# Patient Record
Sex: Male | Born: 1955 | Race: Black or African American | Hispanic: No | Marital: Married | Smoking: Never smoker
Health system: Southern US, Community
[De-identification: ages and names within clinical notes are randomized; demographics above are authoritative.]

## PROBLEM LIST (undated history)

## (undated) DIAGNOSIS — E785 Hyperlipidemia, unspecified: Secondary | ICD-10-CM

## (undated) HISTORY — PX: HERNIA REPAIR: SHX51

---

## 2002-03-21 ENCOUNTER — Ambulatory Visit (HOSPITAL_COMMUNITY): Admission: RE | Admit: 2002-03-21 | Discharge: 2002-03-21 | Payer: Self-pay | Admitting: Internal Medicine

## 2004-07-20 ENCOUNTER — Ambulatory Visit (HOSPITAL_COMMUNITY): Admission: RE | Admit: 2004-07-20 | Discharge: 2004-07-20 | Payer: Self-pay | Admitting: Family Medicine

## 2005-01-12 ENCOUNTER — Ambulatory Visit: Payer: Self-pay | Admitting: Internal Medicine

## 2007-01-15 ENCOUNTER — Emergency Department (HOSPITAL_COMMUNITY): Admission: EM | Admit: 2007-01-15 | Discharge: 2007-01-15 | Payer: Self-pay | Admitting: Emergency Medicine

## 2010-01-26 ENCOUNTER — Emergency Department (HOSPITAL_COMMUNITY): Admission: EM | Admit: 2010-01-26 | Discharge: 2010-01-26 | Payer: Self-pay | Admitting: Emergency Medicine

## 2010-08-04 LAB — BASIC METABOLIC PANEL
BUN: 11 mg/dL (ref 6–23)
CO2: 30 mEq/L (ref 19–32)
Chloride: 103 mEq/L (ref 96–112)
Glucose, Bld: 89 mg/dL (ref 70–99)
Potassium: 4 mEq/L (ref 3.5–5.1)

## 2010-08-04 LAB — CBC
MCV: 85 fL (ref 78.0–100.0)
Platelets: 210 10*3/uL (ref 150–400)
RBC: 4.91 MIL/uL (ref 4.22–5.81)
RDW: 13.6 % (ref 11.5–15.5)

## 2010-08-04 LAB — POCT CARDIAC MARKERS
CKMB, poc: <1 ng/mL — ABNORMAL LOW (ref 1.0–8.0)
Troponin i, poc: <0.05 ng/mL (ref 0.00–0.09)

## 2010-08-04 LAB — DIFFERENTIAL
Basophils Absolute: 0 10*3/uL (ref 0.0–0.1)
Basophils Relative: 1 % (ref 0–1)
Eosinophils Absolute: 0.1 10*3/uL (ref 0.0–0.7)
Eosinophils Relative: 2 % (ref 0–5)
Lymphs Abs: 1.7 10*3/uL (ref 0.7–4.0)
Neutrophils Relative %: 58 % (ref 43–77)

## 2011-03-03 LAB — URINALYSIS, ROUTINE W REFLEX MICROSCOPIC
Bilirubin Urine: NEGATIVE
Ketones, ur: NEGATIVE
Nitrite: NEGATIVE
Protein, ur: NEGATIVE
pH: 6

## 2017-06-14 ENCOUNTER — Ambulatory Visit: Payer: Self-pay | Admitting: General Surgery

## 2019-07-31 ENCOUNTER — Ambulatory Visit: Payer: Self-pay | Attending: Internal Medicine

## 2019-07-31 DIAGNOSIS — Z23 Encounter for immunization: Secondary | ICD-10-CM

## 2019-07-31 NOTE — Progress Notes (Signed)
   Covid-19 Vaccination Clinic  Name:  Andres Perry    MRN: [id number] DOB: 22-Nov-1955  07/31/2019  [name] was observed post Covid-19 immunization for 15 minutes without incident. He was provided with Vaccine Information Sheet and instruction to access the V-Safe system.   [name] was instructed to call 911 with any severe reactions post vaccine: Marland Kitchen Difficulty breathing  . Swelling of face and throat  . A fast heartbeat  . A bad rash all over body  . Dizziness and weakness   Immunizations Administered    Name Date Dose VIS Date Route   Moderna COVID-19 Vaccine 07/31/2019  9:01 AM 0.5 mL 04/22/2019 Intramuscular   Manufacturer: Moderna   Lot: [id number]   NDC: 74451-460-47

## 2019-09-03 ENCOUNTER — Ambulatory Visit: Payer: Self-pay | Attending: Internal Medicine

## 2019-09-03 DIAGNOSIS — Z23 Encounter for immunization: Secondary | ICD-10-CM

## 2019-09-03 NOTE — Progress Notes (Signed)
   Covid-19 Vaccination Clinic  Name:  CHARVIS LIGHTNER    MRN: [id number] DOB: Sep 11, 1955  09/03/2019  Mr. Pizzi was observed post Covid-19 immunization for 15 minutes without incident. He was provided with Vaccine Information Sheet and instruction to access the V-Safe system.   Mr. Biel was instructed to call 911 with any severe reactions post vaccine: Marland Kitchen Difficulty breathing  . Swelling of face and throat  . A fast heartbeat  . A bad rash all over body  . Dizziness and weakness   Immunizations Administered    Name Date Dose VIS Date Route   Moderna COVID-19 Vaccine 09/03/2019  8:36 AM 0.5 mL 04/22/2019 Intramuscular   Manufacturer: Moderna   Lot: [id number]   NDC: 25894-834-75

## 2020-03-03 ENCOUNTER — Other Ambulatory Visit: Payer: Self-pay

## 2020-03-03 ENCOUNTER — Other Ambulatory Visit (HOSPITAL_COMMUNITY): Payer: Self-pay | Admitting: Family Medicine

## 2020-03-03 ENCOUNTER — Ambulatory Visit (HOSPITAL_COMMUNITY)
Admission: RE | Admit: 2020-03-03 | Discharge: 2020-03-03 | Disposition: A | Discharge status: Home / Self Care | Payer: Self-pay | Source: Ambulatory Visit | Attending: Family Medicine | Admitting: Family Medicine

## 2020-03-03 DIAGNOSIS — M25561 Pain in right knee: Secondary | ICD-10-CM | POA: Insufficient documentation

## 2020-07-08 ENCOUNTER — Encounter: Payer: Self-pay | Admitting: Orthopaedic Surgery

## 2020-07-08 ENCOUNTER — Other Ambulatory Visit: Payer: Self-pay

## 2020-07-08 ENCOUNTER — Ambulatory Visit (INDEPENDENT_AMBULATORY_CARE_PROVIDER_SITE_OTHER): Payer: BC Managed Care – PPO | Admitting: Orthopaedic Surgery

## 2020-07-08 DIAGNOSIS — M1711 Unilateral primary osteoarthritis, right knee: Secondary | ICD-10-CM | POA: Insufficient documentation

## 2020-07-08 MED ORDER — METHYLPREDNISOLONE ACETATE 40 MG/ML IJ SUSP
40.0000 mg | INTRAMUSCULAR | Status: AC | PRN
  Administered 2020-07-08: 40 mg via INTRA_ARTICULAR

## 2020-07-08 MED ORDER — BUPIVACAINE HCL 0.25 % IJ SOLN
4.0000 mL | INTRAMUSCULAR | Status: AC | PRN
  Administered 2020-07-08: 4 mL via INTRA_ARTICULAR

## 2020-07-08 MED ORDER — LIDOCAINE HCL 1 % IJ SOLN
0.5000 mL | INTRAMUSCULAR | Status: AC | PRN
  Administered 2020-07-08: .5 mL

## 2020-07-08 NOTE — Progress Notes (Signed)
Office Visit Note   Patient: Andres Perry           Date of Birth: April 01, 1956           MRN: [id number] Visit Date: 07/08/2020              Requested by: No referring provider defined for this encounter. PCP: No primary care provider on file.   Assessment & Plan: Visit Diagnoses:  1. Unilateral primary osteoarthritis, right knee     Plan: Right knee injection performed which she tolerated well.  Recheck 5 weeks.  Follow-Up Instructions: No follow-ups on file.   Orders:  No orders of the defined types were placed in this encounter.  No orders of the defined types were placed in this encounter.     Procedures: Large Joint Inj: R knee on 07/08/2020 3:48 PM Indications: pain and joint swelling Details: 22 G 1.5 in needle, anterolateral approach  Arthrogram: No  Medications: 40 mg methylPREDNISolone acetate 40 MG/ML; 0.5 mL lidocaine 1 %; 4 mL bupivacaine 0.25 % Outcome: tolerated well, no immediate complications Procedure, treatment alternatives, risks and benefits explained, specific risks discussed. Consent was given by the patient. Immediately prior to procedure a time out was called to verify the correct patient, procedure, equipment, support staff and site/side marked as required. Patient was prepped and draped in the usual sterile fashion.       Clinical Data: No additional findings.   Subjective: Chief Complaint  Patient presents with  . Right Knee - Pain    HPI 65 year old male with right knee pain x5 months.  He has no known injury.  He states he has had swelling pain with activities such as turning.  Is not actually locked but he said he has had times when he has great difficulty walking and it is very stiff when he first gets up.  He uses meloxicam without relief.  He has had times when it swells when he is on it more during the day and the ibuprofen has brought it down some.  He works part-time as a Naval architect.  He has problems getting up and down in  the truck.  Previous x-rays October 2021 demonstrated some small marginal osteophytes without significant joint space narrowing.  No history of gout.  Patient works for [employer].  Review of Systems all other systems noncontributory to HPI.   Objective: Vital Signs: Ht 5\' 7"  (1.702 m)   Wt 170 lb (77.1 kg)   BMI 26.63 kg/m   Physical Exam Constitutional:      Appearance: He is well-developed and well-nourished.  HENT:     Head: Normocephalic and atraumatic.  Eyes:     Extraocular Movements: EOM normal.     Pupils: Pupils are equal, round, and reactive to light.  Neck:     Thyroid: No thyromegaly.     Trachea: No tracheal deviation.  Cardiovascular:     Rate and Rhythm: Normal rate.  Pulmonary:     Effort: Pulmonary effort is normal.     Breath sounds: No wheezing.  Abdominal:     General: Bowel sounds are normal.     Palpations: Abdomen is soft.  Skin:    General: Skin is warm and dry.     Capillary Refill: Capillary refill takes less than 2 seconds.  Neurological:     Mental Status: He is alert and oriented to person, place, and time.  Psychiatric:        Mood and Affect: Mood  and affect normal.        Behavior: Behavior normal.        Thought Content: Thought content normal.        Judgment: Judgment normal.     Ortho Exam patient has crepitus with knee range of motion collateral ligaments are stable he does reach full extension.  Flexion to 120 degrees.  Negative hip range of motion no popliteal compression no palpable Baker's cyst.  Distal pulses are 2+.  Specialty Comments:  No specialty comments available.  Imaging: No results found.   PMFS History: Patient Active Problem List   Diagnosis Date Noted  . Unilateral primary osteoarthritis, right knee 07/08/2020   No past medical history on file.  No family history on file.   Social History   Occupational History  . Not on file  Tobacco Use  . Smoking status: Never Smoker  . Smokeless tobacco:  Never Used  Substance and Sexual Activity  . Alcohol use: Not Currently  . Drug use: Not on file  . Sexual activity: Not on file

## 2020-08-12 ENCOUNTER — Ambulatory Visit (INDEPENDENT_AMBULATORY_CARE_PROVIDER_SITE_OTHER): Payer: BC Managed Care – PPO | Admitting: Orthopaedic Surgery

## 2020-08-12 ENCOUNTER — Other Ambulatory Visit: Payer: Self-pay

## 2020-08-12 DIAGNOSIS — M1711 Unilateral primary osteoarthritis, right knee: Secondary | ICD-10-CM

## 2020-08-12 NOTE — Progress Notes (Signed)
Office Visit Note   Patient: Andres Perry           Date of Birth: [birth year]           MRN: [id number] Visit Date: 08/12/2020              Requested by: No referring provider defined for this encounter. PCP: Patient, No Pcp Per   Assessment & Plan: Visit Diagnoses:  1. Unilateral primary osteoarthritis, right knee     Plan: Patient has primarily medial arthritis medial joint line narrowing with intermittent joint effusion.  We will recheck him in 61months.  If he is having persistent problems will consider MRI imaging studies.  He likely has some degenerative changes in the medial compartment with a degenerative medial meniscal tear midportion extending to the posterior horn.  Follow-Up Instructions: Return in about 2 months (around 10/12/2020).   Orders:  No orders of the defined types were placed in this encounter.  No orders of the defined types were placed in this encounter.     Procedures: No procedures performed   Clinical Data: No additional findings.   Subjective: Chief Complaint  Patient presents with  . Right Knee - Follow-up    HPI 65 year old male returns for follow-up right knee injection 07/08/2020.  He states he continues to have some catching and popping.  It sometimes wakes him up he has problems with stiffness.  Some days he has minimal problems with his knee.  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  He has to roll some doors up crank up to release the trailer etc.  He states the knee injection may be gave him 20% relief but not 50%.  He has more problems climbing up and down on the truck when he works on the door.  Review of Systems All other systems noncontributory to HPI.  Objective: Vital Signs: There were no vitals taken for this visit.  Physical Exam Constitutional:      Appearance: He is well-developed.  HENT:     Head: Normocephalic and atraumatic.  Eyes:     Pupils: Pupils are equal, round, and reactive to light.   Neck:     Thyroid: No thyromegaly.     Trachea: No tracheal deviation.  Cardiovascular:     Rate and Rhythm: Normal rate.  Pulmonary:     Effort: Pulmonary effort is normal.     Breath sounds: No wheezing.  Abdominal:     General: Bowel sounds are normal.     Palpations: Abdomen is soft.  Skin:    General: Skin is warm and dry.     Capillary Refill: Capillary refill takes less than 2 seconds.  Neurological:     Mental Status: He is alert and oriented to person, place, and time.  Psychiatric:        Behavior: Behavior normal.        Thought Content: Thought content normal.        Judgment: Judgment normal.     Ortho Exam patient ambulates with a right knee limp that gets better after multiple steps.  Collateral ligaments are stable more tenderness along the medial joint line directly the region of the medial collateral ligament.  No palpable Baker's cyst no pain with hyperextension ACL PCL exam is normal hip range of motion is normal pulses 2+.  Anterior tib EHL is intact.  Specialty Comments:  No specialty comments available.  Imaging: No results found.   PMFS History: Patient Active Problem List  Diagnosis Date Noted  . Unilateral primary osteoarthritis, right knee 07/08/2020   No past medical history on file.  No family history on file.   Social History   Occupational History  . Not on file  Tobacco Use  . Smoking status: Never Smoker  . Smokeless tobacco: Never Used  Substance and Sexual Activity  . Alcohol use: Not Currently  . Drug use: Not on file  . Sexual activity: Not on file

## 2020-10-07 ENCOUNTER — Ambulatory Visit: Payer: BC Managed Care – PPO | Admitting: Orthopaedic Surgery

## 2022-01-31 ENCOUNTER — Ambulatory Visit (INDEPENDENT_AMBULATORY_CARE_PROVIDER_SITE_OTHER): Payer: BC Managed Care – PPO

## 2022-01-31 ENCOUNTER — Ambulatory Visit
Admission: EM | Admit: 2022-01-31 | Discharge: 2022-01-31 | Disposition: A | Discharge status: Home / Self Care | Payer: BC Managed Care – PPO | Attending: Nurse Practitioner | Admitting: Nurse Practitioner

## 2022-01-31 ENCOUNTER — Encounter: Payer: Self-pay | Admitting: Emergency Medicine

## 2022-01-31 DIAGNOSIS — R140 Abdominal distension (gaseous): Secondary | ICD-10-CM | POA: Diagnosis not present

## 2022-01-31 DIAGNOSIS — N3000 Acute cystitis without hematuria: Secondary | ICD-10-CM | POA: Diagnosis present

## 2022-01-31 LAB — POCT URINALYSIS DIP (MANUAL ENTRY)
Bilirubin, UA: NEGATIVE
Glucose, UA: NEGATIVE mg/dL
Nitrite, UA: POSITIVE — AB
Protein Ur, POC: NEGATIVE mg/dL
Spec Grav, UA: 1.01 (ref 1.010–1.025)
Urobilinogen, UA: 0.2 E.U./dL
pH, UA: 5.5 (ref 5.0–8.0)

## 2022-01-31 MED ORDER — POLYETHYLENE GLYCOL 3350 17 GM/SCOOP PO POWD: 1.0000 | Freq: Once | ORAL | 0 refills | Status: DC

## 2022-01-31 MED ORDER — TAMSULOSIN HCL 0.4 MG PO CAPS: 0.4000 mg | ORAL_CAPSULE | Freq: Every day | ORAL | 0 refills | Status: DC

## 2022-01-31 NOTE — Discharge Instructions (Addendum)
-  Urinalysis is positive for urinary tract infection.  Urine culture is pending to ensure you are being treated with the appropriate antibiotic.  Continue the antibiotic previously prescribed.  The medication needs to be changed, you will be contacted. -Your x-ray does not show any signs of a bowel obstruction. -Take medications as prescribed. -Increase fluids. -Ibuprofen or Tylenol for pain, fever, or general discomfort. -Develop a toileting schedule that will allow you to toilet at least every 2 hours. -Avoid caffeine to include tea, soda, and coffee. -Follow-up in the emergency department if you develop fever, chills, worsening abdominal pain, worsening bloating, continued difficulty urinating, or other concerns.

## 2022-01-31 NOTE — ED Triage Notes (Signed)
Stomach pain over the weekend.  States he could not urinate.  States he can urinate a little every 5 minutes.  Was given SMZ/TMP by "doctor on demand"  and was diagnosed with a UTI yesterday.  States his stomach is now swelling.

## 2022-01-31 NOTE — ED Provider Notes (Signed)
[facility]    CSN: [id number] Arrival date & time: 01/31/22  1730      History   Chief Complaint No chief complaint on file.   HPI Andres Perry is a 65 y.o. male.   The history is provided by the patient.   Patient presents for a 2-day history of urinary urgency, decreased urine stream, hesitancy, and suprapubic pressure.  Patient states he has not been able to go with the regular urine stream over the past 2 days.  States he can urinate a little every 5 minutes.  Patient completed a visit with "Dr. Glenna Fellows" 1 day ago and was diagnosed with a urinary tract infection.  Patient was prescribed Bactrim for his symptoms.  Patient states that he has taken 2 doses, but has not noticed any improvement.  Patient also complains of constipation, stating his last bowel movement was approximately 2 days ago.  Patient denies fever, chills, chest pain, shortness of breath, difficulty breathing, nausea, vomiting, or diarrhea.  He states that he has been having lower abdominal pain and has been taking ibuprofen for his symptoms.  He also reports a decreased appetite.  Patient denies any previous history of prostate disease, although he states he does not have a primary care physician.  He is scheduled to establish care with a PCP on 9/19.    History reviewed. No pertinent past medical history.  Patient Active Problem List   Diagnosis Date Noted   Unilateral primary osteoarthritis, right knee 07/08/2020    History reviewed. No pertinent surgical history.     Home Medications    Prior to Admission medications   Medication Sig Start Date End Date Taking? Authorizing Provider  tamsulosin (FLOMAX) 0.4 MG CAPS capsule Take 1 capsule (0.4 mg total) by mouth daily after supper. 01/31/22  Yes Janifer Gieselman-Warren, Sadie Haber, NP  escitalopram (LEXAPRO) 10 MG tablet Take 10 mg by mouth daily. 03/23/20   [provider]  meloxicam (MOBIC) 15 MG tablet Take 15 mg by mouth daily. 06/21/20    [provider]  simvastatin (ZOCOR) 40 MG tablet Take 40 mg by mouth at bedtime. 05/25/20   [provider]  tadalafil (CIALIS) 5 MG tablet Take 5 mg by mouth daily. 06/21/20   [provider]  zolpidem (AMBIEN) 5 MG tablet Take 5 mg by mouth at bedtime as needed. 05/25/20   [provider]    Family History History reviewed. No pertinent family history.  Social History Social History   Tobacco Use   Smoking status: Never   Smokeless tobacco: Never  Substance Use Topics   Alcohol use: Not Currently     Allergies   Patient has no known allergies.   Review of Systems Review of Systems Per HPI  Physical Exam Triage Vital Signs ED Triage Vitals  Enc Vitals Group     BP 01/31/22 1737 130/65     Pulse Rate 01/31/22 1737 98     Resp 01/31/22 1737 16     Temp 01/31/22 1737 98.6 F (37 C)     Temp Source 01/31/22 1737 Oral     SpO2 01/31/22 1737 96 %     Weight --      Height --      Head Circumference --      Peak Flow --      Pain Score 01/31/22 1739 9     Pain Loc --      Pain Edu? --      Excl.  in GC? --    No data found.  Updated Vital Signs BP 130/65 (BP Location: Right Arm)   Pulse 98   Temp 98.6 F (37 C) (Oral)   Resp 16   SpO2 96%   Visual Acuity Right Eye Distance:   Left Eye Distance:   Bilateral Distance:    Right Eye Near:   Left Eye Near:    Bilateral Near:     Physical Exam Vitals and nursing note reviewed.  Constitutional:      General: He is not in acute distress.    Appearance: Normal appearance.  HENT:     Head: Normocephalic.  Eyes:     Extraocular Movements: Extraocular movements intact.     Conjunctiva/sclera: Conjunctivae normal.     Pupils: Pupils are equal, round, and reactive to light.  Cardiovascular:     Rate and Rhythm: Normal rate and regular rhythm.     Pulses: Normal pulses.     Heart sounds: Normal heart sounds.  Pulmonary:     Effort: Pulmonary effort is normal.     Breath  sounds: Normal breath sounds.  Abdominal:     General: Bowel sounds are normal. There is distension.     Tenderness: There is abdominal tenderness in the suprapubic area. There is no right CVA tenderness or left CVA tenderness.  Musculoskeletal:     Cervical back: Normal range of motion.  Lymphadenopathy:     Cervical: No cervical adenopathy.  Skin:    General: Skin is warm and dry.  Neurological:     General: No focal deficit present.     Mental Status: He is alert and oriented to person, place, and time.  Psychiatric:        Mood and Affect: Mood normal.        Behavior: Behavior normal.      UC Treatments / Results  Labs (all labs ordered are listed, but only abnormal results are displayed) Labs Reviewed  POCT URINALYSIS DIP (MANUAL ENTRY) - Abnormal; Notable for the following components:      Result Value   Clarity, UA hazy (*)    Ketones, POC UA trace (5) (*)    Blood, UA large (*)    Nitrite, UA Positive (*)    Leukocytes, UA Moderate (2+) (*)    All other components within normal limits  URINE CULTURE    EKG   Radiology DG Abd 1 View  Result Date: 01/31/2022 CLINICAL DATA:  Abdominal distension. EXAM: ABDOMEN - 1 VIEW COMPARISON:  Abdominal CT 07/20/2004. FINDINGS: The bowel gas pattern is normal. No supine evidence of bowel wall thickening or free intraperitoneal air. There are mild degenerative changes in the spine associated with a mild convex right lumbar scoliosis. No suspicious abdominal calcifications are identified. Metallic button and zipper overly the lower pelvis. IMPRESSION: No radiographic evidence of active abdominal process. Electronically Signed   By: Carey Bullocks M.D.   On: 01/31/2022 18:15    Procedures Procedures (including critical care time)  Medications Ordered in UC Medications - No data to display  Initial Impression / Assessment and Plan / UC Course  I have reviewed the triage vital signs and the nursing notes.  Pertinent labs &  imaging results that were available during my care of the patient were reviewed by me and considered in my medical decision making (see chart for details).  The patient presents with a 2-day history of urinary urgency, hesitancy, and decreased urine flow.  On exam, patient has  abdominal distention, but he does not have any CVA tenderness.  X-ray was normal, without symptoms of constipation or obvious obstruction.  Urinalysis is positive for nitrates and leukocytes, consistent with a urinary tract infection, urine culture is pending to ensure patient is being treated with the appropriate antibiotic.Marland Kitchen  Discussed with patient that most likely this may be causing his urinary symptoms and retention at this time.  Patient was prescribed Flomax to help with his urine stream.  Patient was advised that if his symptoms of urinary retention and abdominal distention continue, recommend that he go to the emergency department.  Patient would like to see if the antibiotics are working within the next 12 to 24 hours.  I am in agreement with this plan of care.  Patient was advised to go sooner for any worsening symptoms.  Patient was also advised that if symptoms do not improve within that timeframe, that he go to the emergency department for further evaluation.  Supportive care recommendations were provided to the patient.  Patient verbalizes understanding.  All questions were answered. Final Clinical Impressions(s) / UC Diagnoses   Final diagnoses:  Acute cystitis without hematuria     Discharge Instructions      -Urinalysis is positive for urinary tract infection.  Urine culture is pending to ensure you are being treated with the appropriate antibiotic.  Continue the antibiotic previously prescribed.  The medication needs to be changed, you will be contacted. -Your x-ray does not show any signs of a bowel obstruction. -Take medications as prescribed. -Increase fluids. -Ibuprofen or Tylenol for pain, fever, or  general discomfort. -Develop a toileting schedule that will allow you to toilet at least every 2 hours. -Avoid caffeine to include tea, soda, and coffee. -Follow-up in the emergency department if you develop fever, chills, worsening abdominal pain, worsening bloating, continued difficulty urinating, or other concerns.     ED Prescriptions     Medication Sig Dispense Auth. Provider   tamsulosin (FLOMAX) 0.4 MG CAPS capsule Take 1 capsule (0.4 mg total) by mouth daily after supper. 30 capsule Xara Paulding-Warren, Sadie Haber, NP   polyethylene glycol powder (GLYCOLAX/MIRALAX) 17 GM/SCOOP powder  (Status: Discontinued) Take 255 g by mouth once for 1 dose. 255 g Aliyah Abeyta-Warren, Sadie Haber, NP      PDMP not reviewed this encounter.   Abran Cantor, NP 01/31/22 [id number]

## 2022-02-01 ENCOUNTER — Emergency Department (HOSPITAL_COMMUNITY)
Admission: EM | Admit: 2022-02-01 | Discharge: 2022-02-01 | Disposition: A | Discharge status: Home / Self Care | Payer: BC Managed Care – PPO | Attending: Emergency Medicine | Admitting: Emergency Medicine

## 2022-02-01 ENCOUNTER — Other Ambulatory Visit: Payer: Self-pay

## 2022-02-01 ENCOUNTER — Emergency Department (HOSPITAL_COMMUNITY): Payer: BC Managed Care – PPO

## 2022-02-01 ENCOUNTER — Encounter (HOSPITAL_COMMUNITY): Payer: Self-pay | Admitting: Emergency Medicine

## 2022-02-01 DIAGNOSIS — R339 Retention of urine, unspecified: Secondary | ICD-10-CM

## 2022-02-01 DIAGNOSIS — K5900 Constipation, unspecified: Secondary | ICD-10-CM | POA: Diagnosis not present

## 2022-02-01 DIAGNOSIS — D72829 Elevated white blood cell count, unspecified: Secondary | ICD-10-CM | POA: Diagnosis not present

## 2022-02-01 DIAGNOSIS — R7989 Other specified abnormal findings of blood chemistry: Secondary | ICD-10-CM | POA: Diagnosis not present

## 2022-02-01 DIAGNOSIS — N410 Acute prostatitis: Secondary | ICD-10-CM | POA: Insufficient documentation

## 2022-02-01 DIAGNOSIS — R1030 Lower abdominal pain, unspecified: Secondary | ICD-10-CM | POA: Diagnosis present

## 2022-02-01 DIAGNOSIS — R748 Abnormal levels of other serum enzymes: Secondary | ICD-10-CM | POA: Insufficient documentation

## 2022-02-01 HISTORY — DX: Hyperlipidemia, unspecified: E78.5

## 2022-02-01 LAB — CBC WITH DIFFERENTIAL/PLATELET
Abs Immature Granulocytes: 0.05 10*3/uL (ref 0.00–0.07)
Basophils Absolute: 0 10*3/uL (ref 0.0–0.1)
Basophils Relative: 0 %
Eosinophils Absolute: 0 10*3/uL (ref 0.0–0.5)
Eosinophils Relative: 0 %
HCT: 38.8 % — ABNORMAL LOW (ref 39.0–52.0)
Hemoglobin: 13 g/dL (ref 13.0–17.0)
Immature Granulocytes: 0 %
Lymphocytes Relative: 6 %
Lymphs Abs: 0.7 10*3/uL (ref 0.7–4.0)
MCH: 29 pg (ref 26.0–34.0)
MCHC: 33.5 g/dL (ref 30.0–36.0)
MCV: 86.6 fL (ref 80.0–100.0)
Monocytes Absolute: 1.1 10*3/uL — ABNORMAL HIGH (ref 0.1–1.0)
Monocytes Relative: 8 %
Neutro Abs: 11.4 10*3/uL — ABNORMAL HIGH (ref 1.7–7.7)
Neutrophils Relative %: 86 %
Platelets: 193 10*3/uL (ref 150–400)
RBC: 4.48 MIL/uL (ref 4.22–5.81)
RDW: 13.5 % (ref 11.5–15.5)
WBC: 13.3 10*3/uL — ABNORMAL HIGH (ref 4.0–10.5)
nRBC: 0 % (ref 0.0–0.2)

## 2022-02-01 LAB — URINALYSIS, ROUTINE W REFLEX MICROSCOPIC
Bilirubin Urine: NEGATIVE
Glucose, UA: NEGATIVE mg/dL
Ketones, ur: 5 mg/dL — AB
Nitrite: NEGATIVE
Protein, ur: NEGATIVE mg/dL
Specific Gravity, Urine: 1.006 (ref 1.005–1.030)
pH: 5 (ref 5.0–8.0)

## 2022-02-01 LAB — COMPREHENSIVE METABOLIC PANEL
ALT: 20 U/L (ref 0–44)
AST: 22 U/L (ref 15–41)
Albumin: 3.5 g/dL (ref 3.5–5.0)
Alkaline Phosphatase: 61 U/L (ref 38–126)
Anion gap: 9 (ref 5–15)
BUN: 18 mg/dL (ref 8–23)
CO2: 26 mmol/L (ref 22–32)
Calcium: 8.7 mg/dL — ABNORMAL LOW (ref 8.9–10.3)
Chloride: 100 mmol/L (ref 98–111)
Creatinine, Ser: 1.49 mg/dL — ABNORMAL HIGH (ref 0.61–1.24)
GFR, Estimated: 51 mL/min — ABNORMAL LOW (ref >60)
Glucose, Bld: 119 mg/dL — ABNORMAL HIGH (ref 70–99)
Potassium: 3.9 mmol/L (ref 3.5–5.1)
Sodium: 135 mmol/L (ref 135–145)
Total Bilirubin: 1 mg/dL (ref 0.3–1.2)
Total Protein: 7.2 g/dL (ref 6.5–8.1)

## 2022-02-01 LAB — LIPASE, BLOOD: Lipase: 113 U/L — ABNORMAL HIGH (ref 11–51)

## 2022-02-01 MED ORDER — IOHEXOL 300 MG/ML  SOLN
100.0000 mL | Freq: Once | INTRAMUSCULAR | Status: AC | PRN
  Administered 2022-02-01: 100 mL via INTRAVENOUS

## 2022-02-01 MED ORDER — SODIUM CHLORIDE 0.9 % IV BOLUS
500.0000 mL | Freq: Once | INTRAVENOUS | Status: AC
  Administered 2022-02-01: 500 mL via INTRAVENOUS

## 2022-02-01 MED ORDER — SULFAMETHOXAZOLE-TRIMETHOPRIM 800-160 MG PO TABS: 1.0000 | ORAL_TABLET | Freq: Two times a day (BID) | ORAL | 0 refills | Status: AC

## 2022-02-01 MED ORDER — MORPHINE SULFATE (PF) 4 MG/ML IV SOLN
4.0000 mg | Freq: Once | INTRAVENOUS | Status: AC
  Administered 2022-02-01: 4 mg via INTRAVENOUS
  Filled 2022-02-01: qty 1

## 2022-02-01 MED ORDER — ONDANSETRON HCL 4 MG/2ML IJ SOLN
4.0000 mg | Freq: Once | INTRAMUSCULAR | Status: AC
  Administered 2022-02-01: 4 mg via INTRAVENOUS
  Filled 2022-02-01: qty 2

## 2022-02-01 NOTE — ED Notes (Signed)
Bladder scanner revealed over 200 mL of urine

## 2022-02-01 NOTE — ED Triage Notes (Signed)
Pt to the ED with abdominal pain since Monday with diarrhea.  Pt states he has had problems with urinary frequency.

## 2022-02-01 NOTE — Discharge Instructions (Addendum)
You had urinary retention, please keep catheter in place and clean this daily until you follow-up with your urologist.  Call to schedule follow-up appointment with Dr. Ronne Binning within the next week.  Your CT scan showed an enlarged prostate but with some signs of inflammation and possible infection.  It please complete Bactrim that you were prescribed on Monday and you have been prescribed additional 25 days of Bactrim for extended course of antibiotics for potential prostatitis.  If you develop fevers, nausea, vomiting, worsening pain, urine is not flowing into your catheter bag or other new or concerning symptoms you should immediately return to the emergency department or call the urologist.

## 2022-02-01 NOTE — ED Notes (Signed)
Patient transported to CT 

## 2022-02-01 NOTE — ED Notes (Signed)
Over 2000 mL in urine drainage bag

## 2022-02-02 LAB — URINE CULTURE: Culture: 60000 — AB

## 2022-02-08 ENCOUNTER — Telehealth: Payer: Self-pay

## 2022-02-08 ENCOUNTER — Ambulatory Visit: Payer: BC Managed Care – PPO | Admitting: Physician Assistant

## 2022-02-08 VITALS — BP 106/73 | HR 73

## 2022-02-08 DIAGNOSIS — R339 Retention of urine, unspecified: Secondary | ICD-10-CM

## 2022-02-08 NOTE — Telephone Encounter (Signed)
Tried calling patient to follow up after voiding trial. Called patient with no answer. Left voicemail for patient to call our office back.

## 2022-02-08 NOTE — Progress Notes (Signed)
Fill and Pull Catheter Removal  Patient is present today for a catheter removal.  Patient was cleaned and prepped in a sterile fashion 300 ml of sterile water/ saline was instilled into the bladder when the patient felt the urge to urinate. 10 ml of water was then drained from the balloon.  A 16 FR foley cath was removed from the bladder no complications were noted .  Patient as then given some time to void on their own.  Patient cannot void  on their own but will return later today for follow up PVR Patient tolerated well.  Performed by: Marisue Brooklyn, CMA  Follow up/ Additional notes: Follow up this afternoon for PVR

## 2022-02-08 NOTE — Patient Instructions (Addendum)
Return this afternoon around 2 if you are unable to urinate, sooner if you develop discomfort  Continue tamsulosin (Flomax) until FU in 1 month  Continue antibiotic (Bactrim DS) for 2 days.

## 2022-02-08 NOTE — Progress Notes (Addendum)
02/08/2022 9:50 AM   Andres Perry [birth year] [id number]   Assessment:  1. Urinary retention - Bladder Voiding Trial    Plan: Voiding trial was successful in the office.  Patient is advised to continue tamsulosin daily until follow-up in 1 month for PVR and UA with DRE.  He is advised to return to the office this afternoon for PVR to ensure he does not need reinsertion of the Foley.  He will return immediately if he develops pain with inability to void.  Medical record release for PSA results from primary care visit yesterday.  Patient is aware that PSA levels will most likely be elevated secondary to recent catheterization and prostatitis.  Chief Complaint: No chief complaint on file.   Referring provider: No referring provider defined for this encounter.   History of Present Illness:  Andres Perry is a 66 y.o. year old male who is seen in consultation from ED for evaluation of urinary retention 1 week ago.  Patient was seen in the emergency department on 912 and again on 913 with complaint of urinary frequency, urgency, lower abdominal pain.  He was found to be in retention on the 13th and Foley catheter was placed.  Prior to this patient was given Bactrim DS after an online appointment with diagnosis of UTI.  Patient denies history of UTIs in the past states he has never been in retention. He has had prostatitis and feels this is similar to the sxs he had with that in the past. He established care with a local primary care provider whom he saw yesterday for chronic medical problems.  Patient reports they rechecked his renal function and drew a PSA which he has never had drawn in the past.  Today he presents for voiding trial.  He has been on tamsulosin since initial ED visit.  Currently no complaints of fever, chills, gross hematuria, lower abdominal pain.  Creatinine 1.49 last week.   IPSS = 17, Q OL = 5  ED eval on 01/31/2022 Patient presents for a 2-day history of  urinary urgency, decreased urine stream, hesitancy, and suprapubic pressure.  Patient states he has not been able to go with the regular urine stream over the past 2 days.  States he can urinate a little every 5 minutes.  Patient completed a visit with "Dr. Molly Maduro" 1 day ago and was diagnosed with a urinary tract infection.  Patient was prescribed Bactrim for his symptoms.  Patient states that he has taken 2 doses, but has not noticed any improvement.  Patient also complains of constipation, stating his last bowel movement was approximately 2 days ago.  Patient denies fever, chills, chest pain, shortness of breath, difficulty breathing, nausea, vomiting, or diarrhea.  He states that he has been having lower abdominal pain and has been taking ibuprofen for his symptoms.  He also reports a decreased appetite.  Patient denies any previous history of prostate disease, although he states he does not have a primary care physician.  He is scheduled to establish care with a PCP on 9/19.    Portions of the above documentation were copied from a prior visit for review purposes only. Medical records including notes, lab results, and imaging studies reviewed during pt OV.  Past Medical History:  Past Medical History:  Diagnosis Date   Hyperlipidemia     Past Surgical History:  Past Surgical History:  Procedure Laterality Date   HERNIA REPAIR      Allergies:  No Known Allergies  Family History:  No family history on file.  Social History:  Social History   Tobacco Use   Smoking status: Never   Smokeless tobacco: Never  Vaping Use   Vaping Use: Never used  Substance Use Topics   Alcohol use: Not Currently   Drug use: Not Currently    Review of symptoms:  Constitutional:  Negative for unexplained weight loss, night sweats, fever, chills ENT:  Negative for nose bleeds, sinus pain, painful swallowing CV:  Negative for chest pain, shortness of breath, exercise intolerance, palpitations, loss of  consciousness Resp:  Negative for cough, wheezing, shortness of breath GI:  Negative for nausea, vomiting, diarrhea, bloody stools GU:  Positives noted in HPI; otherwise negative for gross hematuria, dysuria, urinary incontinence Neuro:  Negative for seizures, poor balance, limb weakness, slurred speech Psych:  Negative for lack of energy, depression, anxiety Endocrine:  Negative for polydipsia, polyuria, symptoms of hypoglycemia (dizziness, hunger, sweating) Hematologic:  Negative for anemia, purpura, petechia, prolonged or excessive bleeding, use of anticoagulants   Physical Exam: BP 106/73   Pulse 73   Constitutional:  Alert and oriented, No acute distress.  Patient is quite thin. HEENT: NCAT, moist mucus membranes.  Trachea midline, no masses. Cardiovascular: Regular rate and rhythm without murmur, rub, or gallops No clubbing, cyanosis, or edema. Respiratory: Normal respiratory effort, clear to auscultation bilaterally GI: Abdomen is soft, nontender, nondistended, no abdominal masses BACK:  Non-tender to palpation.  No CVAT Lymph: No cervical or inguinal lymphadenopathy. Skin: No obvious rashes, warm, dry, intact Neurologic: Alert and oriented, Cranial nerves grossly intact, no focal deficits, moving all 4 extremities. Psychiatric: Appropriate. Normal mood and affect.  Laboratory Data: No results found for this or any previous visit (from the past 24 hour(s)).  Lab Results  Component Value Date   WBC 13.3 (H) 02/01/2022   HGB 13.0 02/01/2022   HCT 38.8 (L) 02/01/2022   MCV 86.6 02/01/2022   PLT 193 02/01/2022    Lab Results  Component Value Date   CREATININE 1.49 (H) 02/01/2022    Urinalysis    Component Value Date/Time   COLORURINE AMBER (A) 02/01/2022 1348   APPEARANCEUR CLEAR 02/01/2022 1348   LABSPEC 1.006 02/01/2022 1348   PHURINE 5.0 02/01/2022 1348   GLUCOSEU NEGATIVE 02/01/2022 1348   HGBUR LARGE (A) 02/01/2022 1348   BILIRUBINUR NEGATIVE 02/01/2022 1348    BILIRUBINUR negative 01/31/2022 1747   KETONESUR 5 (A) 02/01/2022 1348   PROTEINUR NEGATIVE 02/01/2022 1348   UROBILINOGEN 0.2 01/31/2022 1747   UROBILINOGEN 0.2 01/15/2007 1716   NITRITE NEGATIVE 02/01/2022 1348   LEUKOCYTESUR SMALL (A) 02/01/2022 1348    Lab Results  Component Value Date   BACTERIA RARE (A) 02/01/2022    Pertinent Imaging: Results for orders placed during the hospital encounter of 01/31/22  DG Abd 1 View  Narrative CLINICAL DATA:  Abdominal distension.  EXAM: ABDOMEN - 1 VIEW  COMPARISON:  Abdominal CT 07/20/2004.  FINDINGS: The bowel gas pattern is normal. No supine evidence of bowel wall thickening or free intraperitoneal air. There are mild degenerative changes in the spine associated with a mild convex right lumbar scoliosis. No suspicious abdominal calcifications are identified. Metallic button and zipper overly the lower pelvis.  IMPRESSION: No radiographic evidence of active abdominal process.   Electronically Signed By: Carey Bullocks M.D. On: 01/31/2022 18:15  No results found for this or any previous visit.     [place], Regan Rakers, PA-C [facility] [place]

## 2022-02-09 ENCOUNTER — Ambulatory Visit (INDEPENDENT_AMBULATORY_CARE_PROVIDER_SITE_OTHER): Payer: BC Managed Care – PPO | Admitting: Physician Assistant

## 2022-02-09 VITALS — BP 124/84 | HR 73

## 2022-02-09 DIAGNOSIS — N410 Acute prostatitis: Secondary | ICD-10-CM

## 2022-02-09 DIAGNOSIS — R972 Elevated prostate specific antigen [PSA]: Secondary | ICD-10-CM | POA: Diagnosis not present

## 2022-02-09 DIAGNOSIS — R339 Retention of urine, unspecified: Secondary | ICD-10-CM

## 2022-02-09 LAB — BLADDER SCAN AMB NON-IMAGING

## 2022-02-09 NOTE — Progress Notes (Signed)
post void residual =712

## 2022-02-09 NOTE — Patient Instructions (Signed)
Continue Bactrim DS  Continue daily tamsulosin (Flomax)

## 2022-02-09 NOTE — Progress Notes (Signed)
Assessment: 1. Acute prostatitis - Urinalysis, Routine w reflex microscopic  2. Urinary retention - Bladder Scan (Post Void Residual) in office - Urinalysis, Routine w reflex microscopic  3. Elevated PSA    Plan: Remain on Flomax and continue Bactrim for full 30 days.  Foley replaced and the patient will FU in 3 weeks for voiding trial as he will have completed full course of antibx. Pt advised to FU sooner if he develops any sxs of systemic illness or develops pain. Strict ED precautions discussed. Pt discussed with Dr. [name] who agrees with plan. Will consider imaging FU if indicated to r/o developing abscess due to patient's significant pain with reinserting Foley.  We will plan recheck of PSA 2 to 3 months following removal of Foley and clearance of UTI symptoms and prostatitis.  Chief Complaint: No chief complaint on file.   HPI: Andres Perry is a 66 y.o. male who presents for continued evaluation of acute urinary retention and prostatitis. The pt was able to void small amounts yesterday, but decreasing amounts until this morning with return of lower abdominal and suprapubic pressure. No fever, chills, dysuria, gross hematuria. Pt remains on Bactrim and Flomax as directed yesterday.  PSA level from primary care office reviewed.  It was 12.9.  Creatinine level improving from ED visit.  Urine culture from ED visit on 01/31/2022 grew 60,000 colonies of Enterobacter Andres Perry resistant to cefazolin and Macrobid  UA= 3-10 RBCs, otherwise clear PVR= 712 mL PSA from primary care visit yesterday = 12.9, no comparison results available.  Portions of the above documentation were copied from a prior visit for review purposes only.  Allergies: No Known Allergies  PMH: Past Medical History:  Diagnosis Date   Hyperlipidemia     PSH: Past Surgical History:  Procedure Laterality Date   HERNIA REPAIR      SH: Social History   Tobacco Use   Smoking status: Never    Smokeless tobacco: Never  Vaping Use   Vaping Use: Never used  Substance Use Topics   Alcohol use: Not Currently   Drug use: Not Currently    ROS: All other review of systems were reviewed and are negative except what is noted above in HPI  PE: BP 124/84   Pulse 73  GENERAL APPEARANCE:  Well appearing, well developed, well nourished, NAD HEENT:  Atraumatic, normocephalic NECK:  Supple. Trachea midline ABDOMEN:  Soft, mild suprapubic tenderness with palpable bladder, no masses EXTREMITIES:  Moves all extremities well, without clubbing, cyanosis, or edema NEUROLOGIC:  Alert and oriented x 3, normal gait, CN II-XII grossly intact MENTAL STATUS:  appropriate BACK:  Non-tender to palpation, No CVAT SKIN:  Warm, dry, and intact   Results: Laboratory Data: Lab Results  Component Value Date   WBC 13.3 (H) 02/01/2022   HGB 13.0 02/01/2022   HCT 38.8 (L) 02/01/2022   MCV 86.6 02/01/2022   PLT 193 02/01/2022    Lab Results  Component Value Date   CREATININE 1.49 (H) 02/01/2022     Urinalysis    Component Value Date/Time   COLORURINE AMBER (A) 02/01/2022 1348   APPEARANCEUR CLEAR 02/01/2022 1348   LABSPEC 1.006 02/01/2022 1348   PHURINE 5.0 02/01/2022 1348   GLUCOSEU NEGATIVE 02/01/2022 1348   HGBUR LARGE (A) 02/01/2022 1348   BILIRUBINUR NEGATIVE 02/01/2022 1348   BILIRUBINUR negative 01/31/2022 1747   KETONESUR 5 (A) 02/01/2022 1348   PROTEINUR NEGATIVE 02/01/2022 1348   UROBILINOGEN 0.2 01/31/2022 1747   UROBILINOGEN  0.2 01/15/2007 1716   NITRITE NEGATIVE 02/01/2022 1348   LEUKOCYTESUR SMALL (A) 02/01/2022 1348    Lab Results  Component Value Date   BACTERIA RARE (A) 02/01/2022    Pertinent Imaging: Results for orders placed during the hospital encounter of 01/31/22  DG Abd 1 View  Narrative CLINICAL DATA:  Abdominal distension.  EXAM: ABDOMEN - 1 VIEW  COMPARISON:  Abdominal CT 07/20/2004.  FINDINGS: The bowel gas pattern is normal. No supine  evidence of bowel wall thickening or free intraperitoneal air. There are mild degenerative changes in the spine associated with a mild convex right lumbar scoliosis. No suspicious abdominal calcifications are identified. Metallic button and zipper overly the lower pelvis.  IMPRESSION: No radiographic evidence of active abdominal process.   Electronically Signed By: Richardean Sale M.D. On: 01/31/2022 18:15  No results found for this or any previous visit.  No results found for this or any previous visit.  No results found for this or any previous visit.  No results found for this or any previous visit.  No results found for this or any previous visit.  No results found for this or any previous visit.  No results found for this or any previous visit.  No results found for this or any previous visit (from the past 24 hour(s)).

## 2022-02-09 NOTE — Telephone Encounter (Signed)
Tried calling patient with no answer to follow up after voiding trail. Left voice message for patient to call our office back. Will try to call patient later today for follow up.

## 2022-02-10 LAB — URINALYSIS, ROUTINE W REFLEX MICROSCOPIC
Bilirubin, UA: NEGATIVE
Glucose, UA: NEGATIVE
Ketones, UA: NEGATIVE
Leukocytes,UA: NEGATIVE
Nitrite, UA: NEGATIVE
Protein,UA: NEGATIVE
Specific Gravity, UA: 1.015 (ref 1.005–1.030)
Urobilinogen, Ur: 0.2 mg/dL (ref 0.2–1.0)
pH, UA: 6.5 (ref 5.0–7.5)

## 2022-02-10 LAB — MICROSCOPIC EXAMINATION: Bacteria, UA: NONE SEEN

## 2022-02-10 IMAGING — DX DG KNEE 1-2V*R*
2 series · 2 of 2 positions shown · non-contrast
Comparison: None.

CLINICAL DATA: Right knee pain 1 week.  No injury

EXAM:
RIGHT KNEE - 1-2 VIEW

[knee ap]
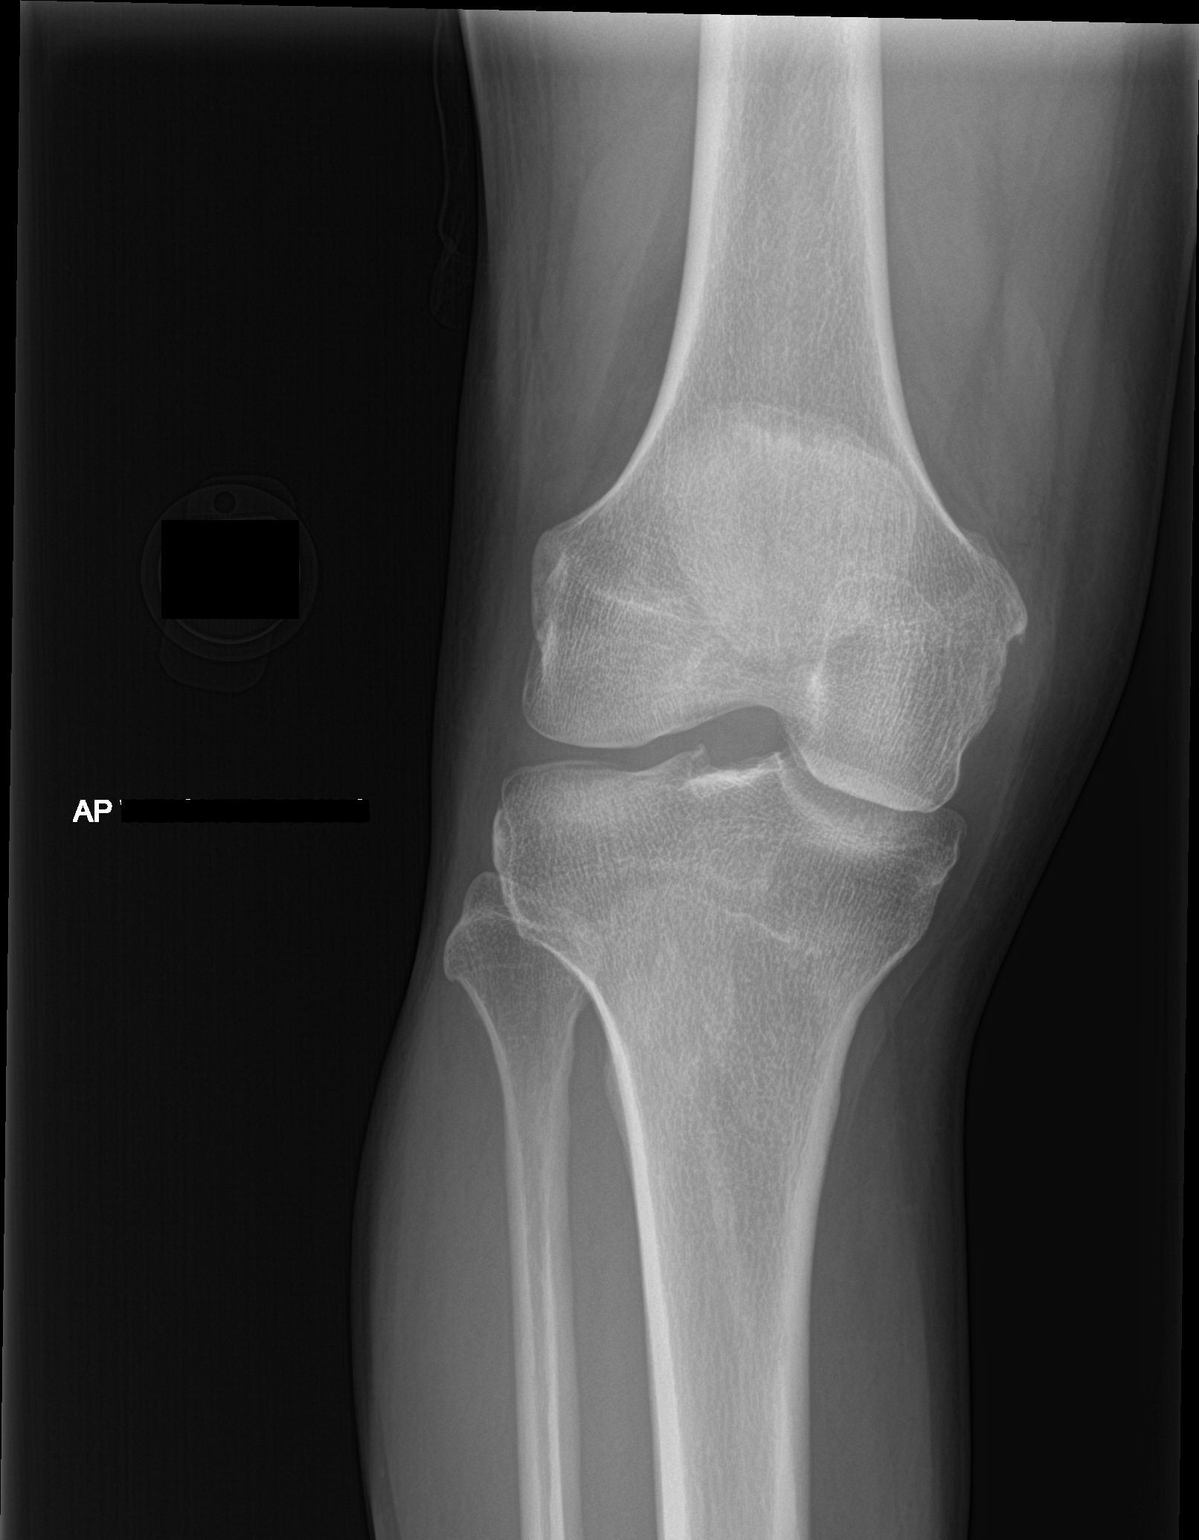

[knee lat]
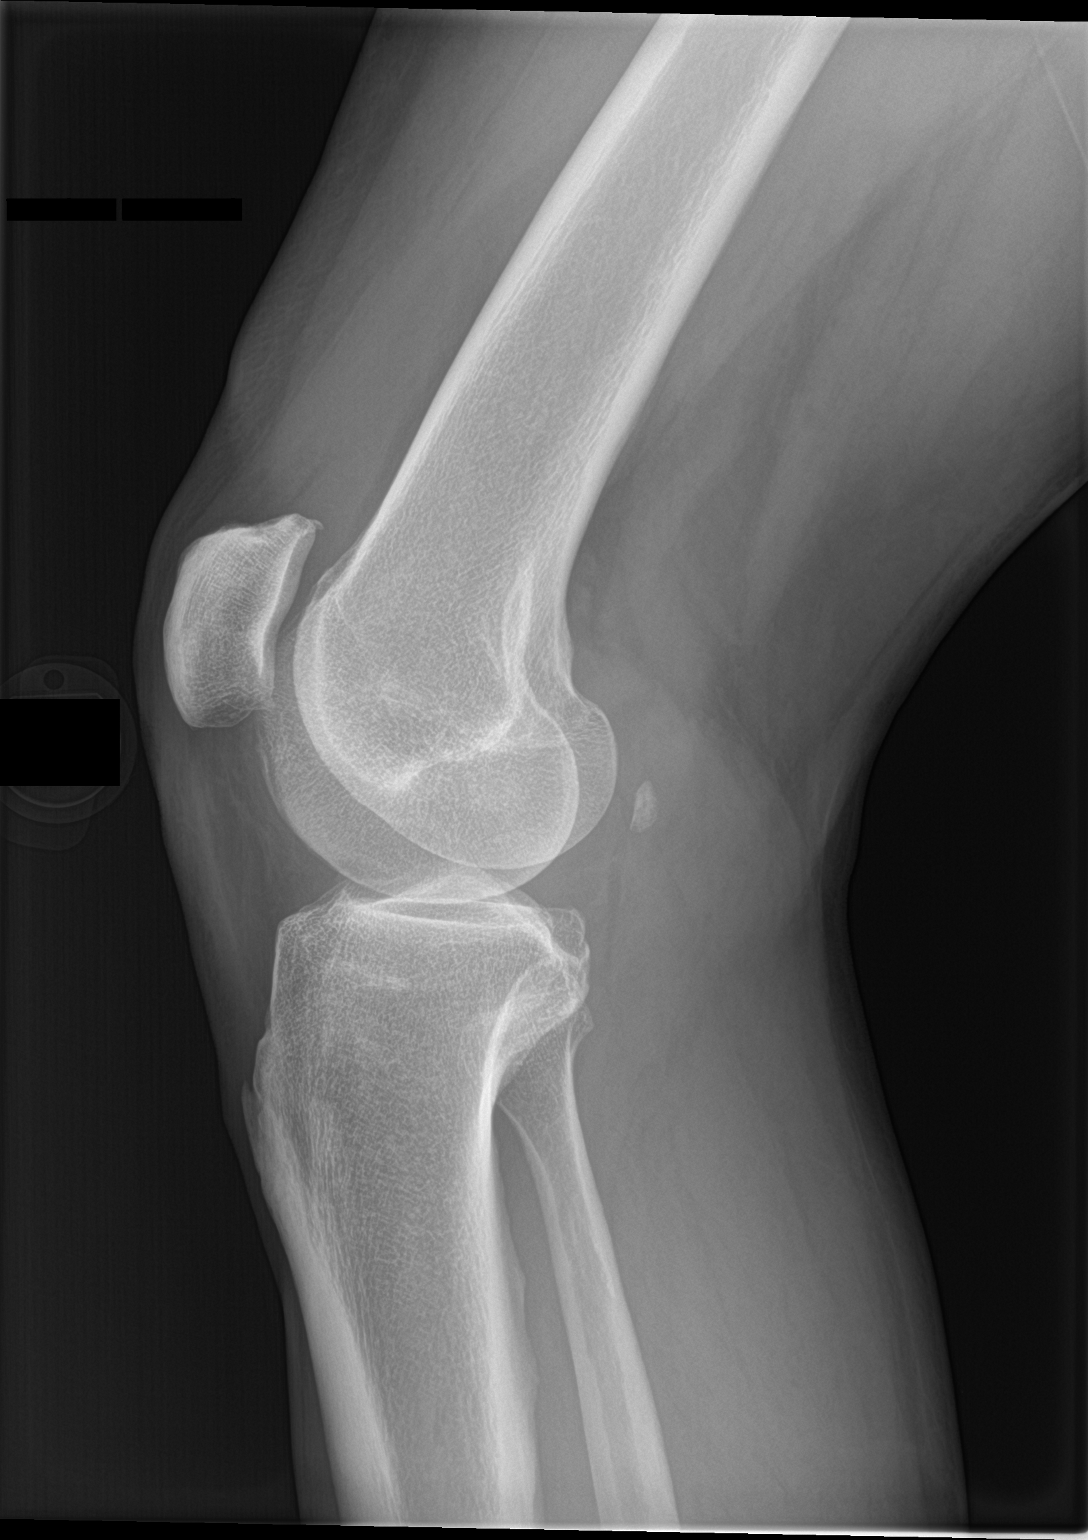

[2 of 2 positions shown; findings below may reference images not displayed]

FINDINGS: Normal alignment no fracture. Joint spaces maintained without
significant spurring. Fabella. Small joint effusion.
IMPRESSION: Small joint effusion.  No significant joint space narrowing.

## 2022-02-15 ENCOUNTER — Encounter: Payer: Self-pay | Admitting: *Deleted

## 2022-02-28 ENCOUNTER — Ambulatory Visit (INDEPENDENT_AMBULATORY_CARE_PROVIDER_SITE_OTHER): Payer: BC Managed Care – PPO | Admitting: Urology

## 2022-02-28 VITALS — BP 125/79 | HR 83

## 2022-02-28 DIAGNOSIS — N401 Enlarged prostate with lower urinary tract symptoms: Secondary | ICD-10-CM | POA: Diagnosis not present

## 2022-02-28 DIAGNOSIS — N138 Other obstructive and reflux uropathy: Secondary | ICD-10-CM | POA: Diagnosis not present

## 2022-02-28 DIAGNOSIS — R339 Retention of urine, unspecified: Secondary | ICD-10-CM

## 2022-02-28 MED ORDER — TAMSULOSIN HCL 0.4 MG PO CAPS: 0.4000 mg | ORAL_CAPSULE | Freq: Two times a day (BID) | ORAL | 11 refills | Status: DC

## 2022-02-28 NOTE — Patient Instructions (Signed)
Acute Urinary Retention, Male  Acute urinary retention is when a person cannot pee (urinate) at all, or can only pee a little. This can come on all of a sudden. If it is not treated, it can lead to kidney problems or other serious problems. What are the causes? A problem with the tube that drains the bladder (urethra). Problems with the nerves in the bladder. Tumors. Certain medicines. An infection. Having trouble pooping (constipation). What increases the risk? Older men are more at risk because their prostate gland may become larger as they age. Other conditions also can increase risk. These include: Diseases, such as multiple sclerosis. Injury to the spinal cord. Diabetes. A condition that affects the way the brain works, such as dementia. Holding back urine due to trauma or because you do not want to use the bathroom. What are the signs or symptoms? Trouble peeing. Pain in the lower belly. How is this treated? Treatment for this condition may include: Medicines. Placing a thin, germ-free tube (catheter) into the bladder to drain pee out of the body. Therapy to treat mental health conditions. Treatment for conditions that may cause this. If needed, you may be treated in the hospital for kidney problems or to manage other problems. Follow these instructions at home: Medicines Take over-the-counter and prescription medicines only as told by your doctor. Ask your doctor what medicines you should stay away from. If you were given an antibiotic medicine, take it as told by your doctor. Do not stop taking it, even if you start to feel better. General instructions Do not smoke or use any products that contain nicotine or tobacco. If you need help quitting, ask your doctor. Drink enough fluid to keep your pee pale yellow. If you were sent home with a tube that drains the bladder, take care of it as told by your doctor. Watch for changes in your symptoms. Tell your doctor about them. If  told, keep track of changes in your blood pressure at home. Tell your doctor about them. Keep all follow-up visits. Contact a doctor if: You have spasms in your bladder that you cannot stop. You leak pee when you have spasms. Get help right away if: You have chills or a fever. You have blood in your pee. You have a tube that drains pee from the bladder and these things happen: The tube stops draining pee. The tube falls out. Summary Acute urinary retention is when you cannot pee at all or you pee too little. If this condition is not treated, it can lead to kidney problems or other serious problems. If you were sent home with a tube (catheter) that drains the bladder, take care of it as told by your doctor. Watch for changes in your symptoms. Tell your doctor about them. This information is not intended to replace advice given to you by your health care provider. Make sure you discuss any questions you have with your health care provider. Document Revised: 01/28/2020 Document Reviewed: 01/28/2020 Elsevier Patient Education  2023 [employer] Inc.  

## 2022-02-28 NOTE — Progress Notes (Signed)
Fill and Pull Catheter Removal  Patient is present today for a catheter removal.  Patient was cleaned and prepped in a sterile fashion 517ml of sterile water/ saline was instilled into the bladder when the patient felt the urge to urinate. 83ml of water was then drained from the balloon.  A 16FR foley cath was removed from the bladder no complications were noted .  Patient as then given some time to void on their own.  Patient can void  269ml on their own after some time.  Patient tolerated well.  Performed by: Raynette Arras LPN  Follow up/ Additional notes: per MD note     Patient returned with PVR of 279  Patient request to go home and will return in the morning for PVR.  Patient aware if he is unable to void throughout the night that he will need to go to the ER. Patient will return in the morning for PVR

## 2022-03-01 ENCOUNTER — Telehealth: Payer: Self-pay

## 2022-03-01 ENCOUNTER — Ambulatory Visit (INDEPENDENT_AMBULATORY_CARE_PROVIDER_SITE_OTHER): Payer: BC Managed Care – PPO | Admitting: Urology

## 2022-03-01 DIAGNOSIS — R339 Retention of urine, unspecified: Secondary | ICD-10-CM

## 2022-03-01 DIAGNOSIS — R300 Dysuria: Secondary | ICD-10-CM | POA: Diagnosis not present

## 2022-03-01 MED ORDER — NITROFURANTOIN MONOHYD MACRO 100 MG PO CAPS: 100.0000 mg | ORAL_CAPSULE | Freq: Two times a day (BID) | ORAL | 0 refills | Status: DC

## 2022-03-01 NOTE — Progress Notes (Signed)
post void residual=228

## 2022-03-01 NOTE — Telephone Encounter (Signed)
Patient states pharmacy did not get his flomax rx.  Spoke to pharmacy staff and they did receive rx, patient notified.  Patient also states that he is having dysuria, he forgot to mention that whe he was in office.  Informed patient to drop off a urine today and we will call him once MD reviews.

## 2022-03-02 LAB — URINALYSIS, ROUTINE W REFLEX MICROSCOPIC
Bilirubin, UA: NEGATIVE
Glucose, UA: NEGATIVE
Nitrite, UA: NEGATIVE
Protein,UA: NEGATIVE
Specific Gravity, UA: 1.025 (ref 1.005–1.030)
Urobilinogen, Ur: 0.2 mg/dL (ref 0.2–1.0)
pH, UA: 5.5 (ref 5.0–7.5)

## 2022-03-02 LAB — MICROSCOPIC EXAMINATION: WBC, UA: >30 /hpf — AB (ref 0–5)

## 2022-03-04 LAB — URINE CULTURE

## 2022-03-07 ENCOUNTER — Encounter: Payer: Self-pay | Admitting: Urology

## 2022-03-07 NOTE — Patient Instructions (Signed)

## 2022-03-07 NOTE — Progress Notes (Signed)
03/01/2022 8:13 AM   Andres Perry [birth year] [id number]  Referring provider: Toma Deiters, MD [address] [place],  [place] [address]  Followup BPH and dysuria   HPI: Andres Perry is a 66yo here for followup for BPh with urinary retention. Voiding trial passed yesterday. He has dysuria since the foley has been removed. Urine stream strong. Intermittent straining to urinate. No other complaints today   PMH: Past Medical History:  Diagnosis Date   Hyperlipidemia     Surgical History: Past Surgical History:  Procedure Laterality Date   HERNIA REPAIR      Home Medications:  Allergies as of 03/01/2022   No Known Allergies      Medication List        Accurate as of March 01, 2022 11:59 PM. If you have any questions, ask your nurse or doctor.          escitalopram 10 MG tablet Commonly known as: LEXAPRO Take 10 mg by mouth daily.   meloxicam 15 MG tablet Commonly known as: MOBIC Take 15 mg by mouth daily.   nitrofurantoin (macrocrystal-monohydrate) 100 MG capsule Commonly known as: MACROBID Take 1 capsule (100 mg total) by mouth 2 (two) times daily. Started by: Wilkie Aye, MD   simvastatin 40 MG tablet Commonly known as: ZOCOR Take 40 mg by mouth at bedtime.   sulfamethoxazole-trimethoprim 800-160 MG tablet Commonly known as: BACTRIM DS Take 1 tablet by mouth 2 (two) times daily.   tadalafil 5 MG tablet Commonly known as: CIALIS Take 5 mg by mouth daily.   tamsulosin 0.4 MG Caps capsule Commonly known as: Flomax Take 1 capsule (0.4 mg total) by mouth 2 (two) times daily.   traZODone 50 MG tablet Commonly known as: DESYREL Take 50 mg by mouth at bedtime.   zolpidem 5 MG tablet Commonly known as: AMBIEN Take 5 mg by mouth at bedtime as needed.        Allergies: No Known Allergies  Family History: No family history on file.  Social History:  reports that he has never smoked. He has never used smokeless tobacco. He reports  that he does not currently use alcohol. He reports that he does not currently use drugs.  ROS: All other review of systems were reviewed and are negative except what is noted above in HPI  Physical Exam: There were no vitals taken for this visit.  Constitutional:  Alert and oriented, No acute distress. HEENT: [place] AT, moist mucus membranes.  Trachea midline, no masses. Cardiovascular: No clubbing, cyanosis, or edema. Respiratory: Normal respiratory effort, no increased work of breathing. GI: Abdomen is soft, nontender, nondistended, no abdominal masses GU: No CVA tenderness.  Lymph: No cervical or inguinal lymphadenopathy. Skin: No rashes, bruises or suspicious lesions. Neurologic: Grossly intact, no focal deficits, moving all 4 extremities. Psychiatric: Normal mood and affect.  Laboratory Data: Lab Results  Component Value Date   WBC 13.3 (H) 02/01/2022   HGB 13.0 02/01/2022   HCT 38.8 (L) 02/01/2022   MCV 86.6 02/01/2022   PLT 193 02/01/2022    Lab Results  Component Value Date   CREATININE 1.49 (H) 02/01/2022    No results found for: "PSA"  No results found for: "TESTOSTERONE"  No results found for: "HGBA1C"  Urinalysis    Component Value Date/Time   COLORURINE AMBER (A) 02/01/2022 1348   APPEARANCEUR Hazy (A) 03/01/2022 1448   LABSPEC 1.006 02/01/2022 1348   PHURINE 5.0 02/01/2022 1348   GLUCOSEU Negative 03/01/2022 1448  HGBUR LARGE (A) 02/01/2022 1348   BILIRUBINUR Negative 03/01/2022 1448   KETONESUR 5 (A) 02/01/2022 1348   PROTEINUR Negative 03/01/2022 1448   PROTEINUR NEGATIVE 02/01/2022 1348   UROBILINOGEN 0.2 01/31/2022 1747   UROBILINOGEN 0.2 01/15/2007 1716   NITRITE Negative 03/01/2022 1448   NITRITE NEGATIVE 02/01/2022 1348   LEUKOCYTESUR 1+ (A) 03/01/2022 1448   LEUKOCYTESUR SMALL (A) 02/01/2022 1348    Lab Results  Component Value Date   LABMICR See below: 03/01/2022   WBCUA >30 (A) 03/01/2022   LABEPIT 0-10 03/01/2022   BACTERIA Many  (A) 03/01/2022    Pertinent Imaging:  Results for orders placed during the hospital encounter of 01/31/22  DG Abd 1 View  Narrative CLINICAL DATA:  Abdominal distension.  EXAM: ABDOMEN - 1 VIEW  COMPARISON:  Abdominal CT 07/20/2004.  FINDINGS: The bowel gas pattern is normal. No supine evidence of bowel wall thickening or free intraperitoneal air. There are mild degenerative changes in the spine associated with a mild convex right lumbar scoliosis. No suspicious abdominal calcifications are identified. Metallic button and zipper overly the lower pelvis.  IMPRESSION: No radiographic evidence of active abdominal process.   Electronically Signed By: Richardean Sale M.D. On: 01/31/2022 18:15  No results found for this or any previous visit.  No results found for this or any previous visit.  No results found for this or any previous visit.  No results found for this or any previous visit.  No valid procedures specified. No results found for this or any previous visit.  No results found for this or any previous visit.   Assessment & Plan:    1. Urinary retention -Continue flomax BID - BLADDER SCAN AMB NON-IMAGING - Urinalysis, Routine w reflex microscopic - Urine Culture - Microscopic Examination  2. Dysuria  - nitrofurantoin, macrocrystal-monohydrate, (MACROBID) 100 MG capsule; Take 1 capsule (100 mg total) by mouth 2 (two) times daily.  Dispense: 14 capsule; Refill: 0   No follow-ups on file.  Nicolette Bang, MD  [facility] [place]

## 2022-03-07 NOTE — Progress Notes (Signed)
02/28/2022 8:15 AM   Andres Perry Oct 05, 1955 [id number]  Referring provider: No referring provider defined for this encounter.  Followup BPH  HPI: Mr Andres Perry is a 66yo here for followup for BPH with Urinary retention. He is currently on flomax 0.4mg  daily. He recently finished bactrim for prostatitis. He currently has an indwelling foley. No other complaints today   PMH: Past Medical History:  Diagnosis Date   Hyperlipidemia     Surgical History: Past Surgical History:  Procedure Laterality Date   HERNIA REPAIR      Home Medications:  Allergies as of 02/28/2022   No Known Allergies      Medication List        Accurate as of February 28, 2022 11:59 PM. If you have any questions, ask your nurse or doctor.          escitalopram 10 MG tablet Commonly known as: LEXAPRO Take 10 mg by mouth daily.   meloxicam 15 MG tablet Commonly known as: MOBIC Take 15 mg by mouth daily.   simvastatin 40 MG tablet Commonly known as: ZOCOR Take 40 mg by mouth at bedtime.   sulfamethoxazole-trimethoprim 800-160 MG tablet Commonly known as: BACTRIM DS Take 1 tablet by mouth 2 (two) times daily.   tadalafil 5 MG tablet Commonly known as: CIALIS Take 5 mg by mouth daily.   tamsulosin 0.4 MG Caps capsule Commonly known as: Flomax Take 1 capsule (0.4 mg total) by mouth 2 (two) times daily. What changed: when to take this   traZODone 50 MG tablet Commonly known as: DESYREL Take 50 mg by mouth at bedtime.   zolpidem 5 MG tablet Commonly known as: AMBIEN Take 5 mg by mouth at bedtime as needed.        Allergies: No Known Allergies  Family History: No family history on file.  Social History:  reports that he has never smoked. He has never used smokeless tobacco. He reports that he does not currently use alcohol. He reports that he does not currently use drugs.  ROS: All other review of systems were reviewed and are negative except what is noted above in  HPI  Physical Exam: BP 125/79   Pulse 83   Constitutional:  Alert and oriented, No acute distress. HEENT: [place] AT, moist mucus membranes.  Trachea midline, no masses. Cardiovascular: No clubbing, cyanosis, or edema. Respiratory: Normal respiratory effort, no increased work of breathing. GI: Abdomen is soft, nontender, nondistended, no abdominal masses GU: No CVA tenderness.  Lymph: No cervical or inguinal lymphadenopathy. Skin: No rashes, bruises or suspicious lesions. Neurologic: Grossly intact, no focal deficits, moving all 4 extremities. Psychiatric: Normal mood and affect.  Laboratory Data: Lab Results  Component Value Date   WBC 13.3 (H) 02/01/2022   HGB 13.0 02/01/2022   HCT 38.8 (L) 02/01/2022   MCV 86.6 02/01/2022   PLT 193 02/01/2022    Lab Results  Component Value Date   CREATININE 1.49 (H) 02/01/2022    No results found for: "PSA"  No results found for: "TESTOSTERONE"  No results found for: "HGBA1C"  Urinalysis    Component Value Date/Time   COLORURINE AMBER (A) 02/01/2022 1348   APPEARANCEUR Hazy (A) 03/01/2022 1448   LABSPEC 1.006 02/01/2022 1348   PHURINE 5.0 02/01/2022 1348   GLUCOSEU Negative 03/01/2022 1448   HGBUR LARGE (A) 02/01/2022 1348   BILIRUBINUR Negative 03/01/2022 1448   KETONESUR 5 (A) 02/01/2022 1348   PROTEINUR Negative 03/01/2022 1448   PROTEINUR NEGATIVE 02/01/2022 1348  UROBILINOGEN 0.2 01/31/2022 1747   UROBILINOGEN 0.2 01/15/2007 1716   NITRITE Negative 03/01/2022 1448   NITRITE NEGATIVE 02/01/2022 1348   LEUKOCYTESUR 1+ (A) 03/01/2022 1448   LEUKOCYTESUR SMALL (A) 02/01/2022 1348    Lab Results  Component Value Date   LABMICR See below: 03/01/2022   WBCUA >30 (A) 03/01/2022   LABEPIT 0-10 03/01/2022   BACTERIA Many (A) 03/01/2022    Pertinent Imaging:  Results for orders placed during the hospital encounter of 01/31/22  DG Abd 1 View  Narrative CLINICAL DATA:  Abdominal distension.  EXAM: ABDOMEN - 1  VIEW  COMPARISON:  Abdominal CT 07/20/2004.  FINDINGS: The bowel gas pattern is normal. No supine evidence of bowel wall thickening or free intraperitoneal air. There are mild degenerative changes in the spine associated with a mild convex right lumbar scoliosis. No suspicious abdominal calcifications are identified. Metallic button and zipper overly the lower pelvis.  IMPRESSION: No radiographic evidence of active abdominal process.   Electronically Signed By: Richardean Sale M.D. On: 01/31/2022 18:15  No results found for this or any previous visit.  No results found for this or any previous visit.  No results found for this or any previous visit.  No results found for this or any previous visit.  No valid procedures specified. No results found for this or any previous visit.  No results found for this or any previous visit.   Assessment & Plan:    1. Urinary retention -Voiding trial passed today. Followup tomorrow with a PVR - Bladder Voiding Trial  2. Benign prostatic hyperplasia with urinary obstruction -Increase flomax to 0.4mg  BID   No follow-ups on file.  Nicolette Bang, MD  [facility] [place]

## 2022-03-08 ENCOUNTER — Encounter: Payer: Self-pay | Admitting: *Deleted

## 2022-03-08 NOTE — Patient Instructions (Signed)
  Procedure: colonoscopy  Estimated body mass index is 24.28 kg/m as calculated from the following:   Height as of this encounter: 5\' 7"  (1.702 m).   Weight as of this encounter: 155 lb (70.3 kg).   Have you had a colonoscopy before?  2006, Dr. Laural Golden  Do you have family history of colon cancer  no  Do you have a family history of polyps? no  Previous colonoscopy with polyps removed? yes  Do you have a history colorectal cancer?   no  Are you diabetic?  no  Do you have a prosthetic or mechanical heart valve? no  Do you have a pacemaker/defibrillator?   no  Have you had endocarditis/atrial fibrillation?  no  Do you use supplemental oxygen/CPAP?  no  Have you had joint replacement within the last 12 months?  no  Do you tend to be constipated or have to use laxatives?  yes   Do you have history of alcohol use? If yes, how much and how often.  no  Do you have history or are you using drugs? If yes, what do are you  using?  no  Have you ever had a stroke/heart attack?  no  Have you ever had a heart or other vascular stent placed,?no  Do you take weight loss medication? no  Do you take any blood-thinning medications such as: (Plavix, aspirin, Coumadin, Aggrenox, Brilinta, Xarelto, Eliquis, Pradaxa, Savaysa or Effient) no  If yes we need the name, milligram, dosage and who is prescribing doctor:               Current Outpatient Medications  Medication Sig Dispense Refill   Multiple Vitamin (MULTIVITAMIN) tablet Take 1 tablet by mouth daily.     nitrofurantoin, macrocrystal-monohydrate, (MACROBID) 100 MG capsule Take 1 capsule (100 mg total) by mouth 2 (two) times daily. 14 capsule 0   simvastatin (ZOCOR) 40 MG tablet Take 40 mg by mouth at bedtime.     tamsulosin (FLOMAX) 0.4 MG CAPS capsule Take 1 capsule (0.4 mg total) by mouth 2 (two) times daily. 60 capsule 11   traZODone (DESYREL) 50 MG tablet Take 50 mg by mouth at bedtime.     sulfamethoxazole-trimethoprim  (BACTRIM DS) 800-160 MG tablet Take 1 tablet by mouth 2 (two) times daily.     No current facility-administered medications for this visit.    No Known Allergies

## 2022-03-14 ENCOUNTER — Encounter: Payer: Self-pay | Admitting: Urology

## 2022-03-14 ENCOUNTER — Ambulatory Visit: Payer: BC Managed Care – PPO | Admitting: Urology

## 2022-03-14 VITALS — BP 137/82 | HR 77

## 2022-03-14 DIAGNOSIS — N138 Other obstructive and reflux uropathy: Secondary | ICD-10-CM

## 2022-03-14 DIAGNOSIS — N401 Enlarged prostate with lower urinary tract symptoms: Secondary | ICD-10-CM | POA: Diagnosis not present

## 2022-03-14 DIAGNOSIS — R339 Retention of urine, unspecified: Secondary | ICD-10-CM | POA: Diagnosis not present

## 2022-03-14 LAB — URINALYSIS, ROUTINE W REFLEX MICROSCOPIC
Bilirubin, UA: NEGATIVE
Glucose, UA: NEGATIVE
Ketones, UA: NEGATIVE
Leukocytes,UA: NEGATIVE
Nitrite, UA: NEGATIVE
Protein,UA: NEGATIVE
RBC, UA: NEGATIVE
Specific Gravity, UA: 1.015 (ref 1.005–1.030)
Urobilinogen, Ur: 0.2 mg/dL (ref 0.2–1.0)
pH, UA: 6 (ref 5.0–7.5)

## 2022-03-14 LAB — BLADDER SCAN AMB NON-IMAGING: Scan Result: 82

## 2022-03-14 MED ORDER — TAMSULOSIN HCL 0.4 MG PO CAPS: 0.4000 mg | ORAL_CAPSULE | Freq: Two times a day (BID) | ORAL | 11 refills | Status: DC

## 2022-03-14 MED ORDER — DOXYCYCLINE HYCLATE 100 MG PO CAPS: 100.0000 mg | ORAL_CAPSULE | Freq: Two times a day (BID) | ORAL | 0 refills | Status: DC

## 2022-03-14 NOTE — Progress Notes (Unsigned)
post void residual=82 

## 2022-03-14 NOTE — Patient Instructions (Signed)

## 2022-03-14 NOTE — Progress Notes (Unsigned)
03/14/2022 10:08 AM   Andres Perry 07-01-1955 [id number]  Referring provider: No referring provider defined for this encounter.  Followup urinary retention  HPI: Andres Perry is a 66yo here for followup for BPH and urinary retention. PVR 82cc. He remains of flomax 0.4mg  BID. He continues to have intermittent dysuria. He has intermittent penile pain which is worse at the initiation of urination and during ejaculation. IPSS 27 QOL 4.    PMH: Past Medical History:  Diagnosis Date   Hyperlipidemia     Surgical History: Past Surgical History:  Procedure Laterality Date   HERNIA REPAIR      Home Medications:  Allergies as of 03/14/2022   No Known Allergies      Medication List        Accurate as of March 14, 2022 10:08 AM. If you have any questions, ask your nurse or doctor.          multivitamin tablet Take 1 tablet by mouth daily.   nitrofurantoin (macrocrystal-monohydrate) 100 MG capsule Commonly known as: MACROBID Take 1 capsule (100 mg total) by mouth 2 (two) times daily.   simvastatin 40 MG tablet Commonly known as: ZOCOR Take 40 mg by mouth at bedtime.   sulfamethoxazole-trimethoprim 800-160 MG tablet Commonly known as: BACTRIM DS Take 1 tablet by mouth 2 (two) times daily.   tamsulosin 0.4 MG Caps capsule Commonly known as: Flomax Take 1 capsule (0.4 mg total) by mouth 2 (two) times daily.   traZODone 50 MG tablet Commonly known as: DESYREL Take 50 mg by mouth at bedtime.        Allergies: No Known Allergies  Family History: No family history on file.  Social History:  reports that he has never smoked. He has never used smokeless tobacco. He reports that he does not currently use alcohol. He reports that he does not currently use drugs.  ROS: All other review of systems were reviewed and are negative except what is noted above in HPI  Physical Exam: BP 137/82   Pulse 77   Constitutional:  Alert and oriented, No acute  distress. HEENT: [place] AT, moist mucus membranes.  Trachea midline, no masses. Cardiovascular: No clubbing, cyanosis, or edema. Respiratory: Normal respiratory effort, no increased work of breathing. GI: Abdomen is soft, nontender, nondistended, no abdominal masses GU: No CVA tenderness.  Lymph: No cervical or inguinal lymphadenopathy. Skin: No rashes, bruises or suspicious lesions. Neurologic: Grossly intact, no focal deficits, moving all 4 extremities. Psychiatric: Normal mood and affect.  Laboratory Data: Lab Results  Component Value Date   WBC 13.3 (H) 02/01/2022   HGB 13.0 02/01/2022   HCT 38.8 (L) 02/01/2022   MCV 86.6 02/01/2022   PLT 193 02/01/2022    Lab Results  Component Value Date   CREATININE 1.49 (H) 02/01/2022    No results found for: "PSA"  No results found for: "TESTOSTERONE"  No results found for: "HGBA1C"  Urinalysis    Component Value Date/Time   COLORURINE AMBER (A) 02/01/2022 1348   APPEARANCEUR Hazy (A) 03/01/2022 1448   LABSPEC 1.006 02/01/2022 1348   PHURINE 5.0 02/01/2022 1348   GLUCOSEU Negative 03/01/2022 1448   HGBUR LARGE (A) 02/01/2022 1348   BILIRUBINUR Negative 03/01/2022 1448   KETONESUR 5 (A) 02/01/2022 1348   PROTEINUR Negative 03/01/2022 1448   PROTEINUR NEGATIVE 02/01/2022 1348   UROBILINOGEN 0.2 01/31/2022 1747   UROBILINOGEN 0.2 01/15/2007 1716   NITRITE Negative 03/01/2022 1448   NITRITE NEGATIVE 02/01/2022 1348   LEUKOCYTESUR  1+ (A) 03/01/2022 1448   LEUKOCYTESUR SMALL (A) 02/01/2022 1348    Lab Results  Component Value Date   LABMICR See below: 03/01/2022   WBCUA >30 (A) 03/01/2022   LABEPIT 0-10 03/01/2022   BACTERIA Many (A) 03/01/2022    Pertinent Imaging: *** Results for orders placed during the hospital encounter of 01/31/22  DG Abd 1 View  Narrative CLINICAL DATA:  Abdominal distension.  EXAM: ABDOMEN - 1 VIEW  COMPARISON:  Abdominal CT 07/20/2004.  FINDINGS: The bowel gas pattern is normal. No  supine evidence of bowel wall thickening or free intraperitoneal air. There are mild degenerative changes in the spine associated with a mild convex right lumbar scoliosis. No suspicious abdominal calcifications are identified. Metallic button and zipper overly the lower pelvis.  IMPRESSION: No radiographic evidence of active abdominal process.   Electronically Signed By: Carey Bullocks M.D. On: 01/31/2022 18:15  No results found for this or any previous visit.  No results found for this or any previous visit.  No results found for this or any previous visit.  No results found for this or any previous visit.  No valid procedures specified. No results found for this or any previous visit.  No results found for this or any previous visit.   Assessment & Plan:    1. Urinary retention *** - BLADDER SCAN AMB NON-IMAGING - Urinalysis, Routine w reflex microscopic  2. Benign prostatic hyperplasia with urinary obstruction ***   No follow-ups on file.  Wilkie Aye, MD  [facility] [place]

## 2022-03-15 NOTE — ED Provider Notes (Signed)
[facility] Provider Note   CSN: [id number] Arrival date & time: 02/01/22  1019     History  Chief Complaint  Patient presents with   Abdominal Pain    Andres Perry is a 66 y.o. male.  Andres Perry is a 66 y.o. male with history of hyperlipidemia, who presents for relation of abdominal pain.  Patient reports that since Monday he has been having lower abdominal pain.  He reports some associated diarrhea.  He reports he was having some urinary frequency on Monday but has not been able to urinate since yesterday evening.  He denies prior history of urinary retention, has never required Foley catheterization.  He reports his lower abdomen feels tight and distended.  He reports that he feels like he needs to have a bowel movement but has only been able to pass small amounts of liquid stool.  No nausea or vomiting, no fevers or chills.  No blood in urine prior to the start of urinary retention.  Denies any testicular pain or swelling.  No other aggravating or alleviating factors.  The history is provided by the patient and medical records.  Abdominal Pain Associated symptoms: constipation and diarrhea   Associated symptoms: no chest pain, no chills, no dysuria, no fever, no hematuria and no shortness of breath        Home Medications Prior to Admission medications   Medication Sig Start Date End Date Taking? Authorizing Provider  simvastatin (ZOCOR) 40 MG tablet Take 40 mg by mouth at bedtime. 05/25/20  Yes [provider]  sulfamethoxazole-trimethoprim (BACTRIM DS) 800-160 MG tablet Take 1 tablet by mouth 2 (two) times daily. Patient not taking: Reported on 03/14/2022 01/30/22  Yes [provider]  traZODone (DESYREL) 50 MG tablet Take 50 mg by mouth at bedtime. 11/30/21  Yes [provider]  doxycycline (VIBRAMYCIN) 100 MG capsule Take 1 capsule (100 mg total) by mouth 2 (two) times daily. 03/14/22   McKenzie, Candee Furbish, MD  Multiple  Vitamin (MULTIVITAMIN) tablet Take 1 tablet by mouth daily.    [provider]  nitrofurantoin, macrocrystal-monohydrate, (MACROBID) 100 MG capsule Take 1 capsule (100 mg total) by mouth 2 (two) times daily. Patient not taking: Reported on 03/14/2022 03/01/22   Cleon Gustin, MD  tamsulosin (FLOMAX) 0.4 MG CAPS capsule Take 1 capsule (0.4 mg total) by mouth 2 (two) times daily. 03/14/22   McKenzie, Candee Furbish, MD      Allergies    Patient has no known allergies.    Review of Systems   Review of Systems  Constitutional:  Negative for chills and fever.  Respiratory:  Negative for shortness of breath.   Cardiovascular:  Negative for chest pain.  Gastrointestinal:  Positive for abdominal pain, constipation and diarrhea.  Genitourinary:  Positive for difficulty urinating and frequency. Negative for dysuria, hematuria, scrotal swelling and testicular pain.    Physical Exam Updated Vital Signs BP 124/82 (BP Location: Right Arm)   Pulse 88   Temp 97.8 F (36.6 C) (Oral)   Resp 17   Ht 5\' 6"  (1.676 m)   Wt 72.6 kg   SpO2 100%   BMI 25.82 kg/m  Physical Exam Vitals and nursing note reviewed.  Constitutional:      General: He is not in acute distress.    Appearance: Normal appearance. He is well-developed. He is not diaphoretic.  HENT:     Head: Normocephalic and atraumatic.  Eyes:     General:  Right eye: No discharge.        Left eye: No discharge.     Pupils: Pupils are equal, round, and reactive to light.  Cardiovascular:     Rate and Rhythm: Normal rate and regular rhythm.     Pulses: Normal pulses.     Heart sounds: Normal heart sounds.  Pulmonary:     Effort: Pulmonary effort is normal. No respiratory distress.     Breath sounds: Normal breath sounds. No wheezing or rales.     Comments: Respirations equal and unlabored, patient able to speak in full sentences, lungs clear to auscultation bilaterally  Abdominal:     General: Bowel sounds are normal.  There is distension.     Palpations: Abdomen is soft. There is no mass.     Tenderness: There is abdominal tenderness in the suprapubic area.     Comments: Lower abdomen distended with palpable tense bladder, tender to palpation over bladder, but the remainder of the abdominal exam is unremarkable, no tenderness in all other quadrants, no guarding or peritoneal signs, no CVA tenderness  Genitourinary:    Comments: Chaperone present during exam. Penis normal, urethral meatus without erythema, or lesion, no discharge, no testicular tenderness, swelling or masses noted. Musculoskeletal:        General: No deformity.     Cervical back: Neck supple.  Skin:    General: Skin is warm and dry.     Capillary Refill: Capillary refill takes less than 2 seconds.  Neurological:     Mental Status: He is alert and oriented to person, place, and time.     Coordination: Coordination normal.     Comments: Speech is clear, able to follow commands Moves extremities without ataxia, coordination intact  Psychiatric:        Mood and Affect: Mood normal.        Behavior: Behavior normal.    ED Results / Procedures / Treatments   Labs (all labs ordered are listed, but only abnormal results are displayed) Labs Reviewed  COMPREHENSIVE METABOLIC PANEL - Abnormal; Notable for the following components:      Result Value   Glucose, Bld 119 (*)    Creatinine, Ser 1.49 (*)    Calcium 8.7 (*)    GFR, Estimated 51 (*)    All other components within normal limits  LIPASE, BLOOD - Abnormal; Notable for the following components:   Lipase 113 (*)    All other components within normal limits  CBC WITH DIFFERENTIAL/PLATELET - Abnormal; Notable for the following components:   WBC 13.3 (*)    HCT 38.8 (*)    Neutro Abs 11.4 (*)    Monocytes Absolute 1.1 (*)    All other components within normal limits  URINALYSIS, ROUTINE W REFLEX MICROSCOPIC - Abnormal; Notable for the following components:   Color, Urine AMBER (*)     Hgb urine dipstick LARGE (*)    Ketones, ur 5 (*)    Leukocytes,Ua SMALL (*)    Bacteria, UA RARE (*)    All other components within normal limits    EKG None  Radiology CT Abdomen Pelvis W Contrast  Result Date: 02/01/2022 CLINICAL DATA:  Abdominal pain since Monday with diarrhea patient states he is had problems with urinary frequency EXAM: CT ABDOMEN AND PELVIS WITH CONTRAST TECHNIQUE: Multidetector CT imaging of the abdomen and pelvis was performed using the standard protocol following bolus administration of intravenous contrast. RADIATION DOSE REDUCTION: This exam was performed according to the departmental  dose-optimization program which includes automated exposure control, adjustment of the mA and/or kV according to patient size and/or use of iterative reconstruction technique. CONTRAST:  OMNIPAQUE IOHEXOL 300 MG/ML  SOLN COMPARISON:  CT abdomen and pelvis 07/20/2004 FINDINGS: Lower chest: No acute abnormality. Hepatobiliary: There are multiple hypodense liver lesions in the bilateral hepatic lobes, some of which are new from prior, for example a 2.1 x 1.9 cm lesion in the left hepatic lobe (series 2, image 16). These are favored to represent hepatic cysts. There is no evidence of intrahepatic biliary ductal dilatation. There is no evidence of hepatic or portal vein thrombus. Gallbladder is distended without evidence of cholelithiasis. Common bile duct is normal in size measuring up to 4 mm. Pancreas: Unremarkable. No pancreatic ductal dilatation or surrounding inflammatory changes. Spleen: Normal in size without focal abnormality. Adrenals/Urinary Tract: Urinary drainage catheter in place with air within the anti dependent aspect of the bladder. Bilateral kidneys demonstrate homogeneous enhancement. There is no evidence of hydro nephrosis. No renal calculi. Bilateral adrenal glands are normal in appearance. Stomach/Bowel: Stomach is within normal limits. Appendix appears normal. No  evidence of bowel wall thickening, distention, or inflammatory changes. There is extensive diverticulosis without findings to suggest diverticulitis. Vascular/Lymphatic: No significant vascular findings are present. No enlarged abdominal or pelvic lymph nodes. Reproductive: Enlarged and heterogeneous prostate gland. There is a 1.4 x 1.0 cm hypoenhancing structure in the anterior aspect of the right prostate gland (series 2, image 75). Other: None Musculoskeletal: Lower lumbar spine predominant degenerative changes with severe right-sided neural foraminal narrowing at L4-L5. IMPRESSION: 1. No evidence of hydronephrosis, nephrolithiasis, or CT finding to suggest pyelonephritis. 2. No CT finding to explain diarrhea. 3. The prostate is slightly enlarged and demonstrates heterogeneous contrast enhancement, which is nonspecific, but could be seen in the setting of prostatitis. Within the anterior aspect of the prostate gland there is a focally hypoenhancing rounded structure, which could represent a developing prostate abscess. Recommend urologic consultation. Electronically Signed   By: Lorenza Cambridge M.D.   On: 02/01/2022 14:39     Procedures Procedures    Medications Ordered in ED Medications  ondansetron (ZOFRAN) injection 4 mg (4 mg Intravenous Given 02/01/22 1303)  morphine (PF) 4 MG/ML injection 4 mg (4 mg Intravenous Given 02/01/22 1300)  sodium chloride 0.9 % bolus 500 mL (0 mLs Intravenous Stopped 02/01/22 1643)  iohexol (OMNIPAQUE) 300 MG/ML solution 100 mL (100 mLs Intravenous Contrast Given 02/01/22 1341)    ED Course/ Medical Decision Making/ A&P                           Medical Decision Making Amount and/or Complexity of Data Reviewed Labs: ordered. Radiology: ordered.  Risk Prescription drug management.   67 y.o. male presents to the ED with complaints of abdominal pain, this involves an extensive number of treatment options, and is a complaint that carries with it a high risk of  complications and morbidity.  The differential diagnosis includes urinary retention, distended bladder, UTI, nephrolithiasis, BPH, prostatitis, bowel obstruction, constipation, obstruction, perforation, diverticulitis  On arrival pt is nontoxic, vitals WNL. Exam significant for distended palpable bladder on exam, patient unable to urinate since last night, high clinical suspicion for urinary retention, suspect that this may be leading to diarrhea/constipation due to pressure on the rectum  Additional history obtained from medical records. Previous records obtained and reviewed   Bladder scan performed, Greater than 200 mL of urine present, Foley catheter  placed without difficulty, 2 L of urine removed from bladder, and patient had resolution of pain  Lab Tests:  I Ordered, reviewed, and interpreted labs, which included: Mild leukocytosis of 13.3, normal hemoglobin, creatinine mildly increased from baseline at 1.49, no other significant electrolyte derangements and normal renal function, lipase minimally elevated, UA with small leukocytes and some RBCs and bacteria present  Imaging Studies ordered:  I ordered imaging studies which included CT pelvis with contrast given new onset urinary retention without prior history to assess for potential mass or obstructive uropathy, I independently visualized and interpreted imaging which showed no hydronephrosis or nephrolithiasis, no evidence of pyelonephritis, no abnormal bowel findings to explain diarrhea,, there is slight enlargement and heterogeneous contrast-enhancement of the prostate which can be seen with prostatitis.,  There is a hypoenhancing rounded structure in the prostate that could represent developing abscess.  ED Course:   I consulted urology and discussed lab and imaging findings with Dr. Ronne Binning, he agrees with plan to maintain foley catheter, recommends 25 day course of bactrim for prostatitis, but given that pt has mild leukocytosis and no  signs of systemic illness can be discharged home with close outpatient follow up with urology clinic.  I discussed results and urology recommendations with pt at bedside and answered all questions. He expresses understanding and agreement with plan, pain has resolved and pt is feeling much better.  At this time there does not appear to be any evidence of an acute emergency medical condition requiring further emergent evaluation and the patient appears stable for discharge with appropriate outpatient follow up. Diagnosis and return precautions discussed with patient who verbalizes understanding and is agreeable to discharge.     Portions of this note were generated with Scientist, clinical (histocompatibility and immunogenetics). Dictation errors may occur despite best attempts at proofreading.         Final Clinical Impression(s) / ED Diagnoses Final diagnoses:  Urinary retention  Acute prostatitis    Rx / DC Orders ED Discharge Orders          Ordered    sulfamethoxazole-trimethoprim (BACTRIM DS) 800-160 MG tablet  2 times daily        02/01/22 1639              Dartha Lodge, PA-C 03/15/22 0845    Loetta Rough, MD 03/20/22 [phone]

## 2022-03-31 NOTE — Progress Notes (Signed)
Ok to schedule. ASA 2.  Would recommend bisacodyl 10mg  daily for 4 days before prep.

## 2022-04-17 ENCOUNTER — Encounter: Payer: Self-pay | Admitting: *Deleted

## 2022-04-17 NOTE — Progress Notes (Signed)
LMTRC on 11/16 & 11/27. Will mail letter

## 2022-04-19 ENCOUNTER — Ambulatory Visit: Payer: BC Managed Care – PPO | Admitting: Urology

## 2022-06-06 DIAGNOSIS — H5203 Hypermetropia, bilateral: Secondary | ICD-10-CM | POA: Diagnosis not present

## 2022-06-06 DIAGNOSIS — H52223 Regular astigmatism, bilateral: Secondary | ICD-10-CM | POA: Diagnosis not present

## 2022-06-06 DIAGNOSIS — H524 Presbyopia: Secondary | ICD-10-CM | POA: Diagnosis not present

## 2022-07-21 ENCOUNTER — Encounter: Payer: Self-pay | Admitting: *Deleted

## 2022-08-07 DIAGNOSIS — E782 Mixed hyperlipidemia: Secondary | ICD-10-CM | POA: Diagnosis not present

## 2022-08-07 DIAGNOSIS — N5201 Erectile dysfunction due to arterial insufficiency: Secondary | ICD-10-CM | POA: Diagnosis not present

## 2022-08-07 DIAGNOSIS — N1831 Chronic kidney disease, stage 3a: Secondary | ICD-10-CM | POA: Diagnosis not present

## 2022-08-07 DIAGNOSIS — F5101 Primary insomnia: Secondary | ICD-10-CM | POA: Diagnosis not present

## 2022-08-07 DIAGNOSIS — R69 Illness, unspecified: Secondary | ICD-10-CM | POA: Diagnosis not present

## 2022-08-07 DIAGNOSIS — Z6825 Body mass index (BMI) 25.0-25.9, adult: Secondary | ICD-10-CM | POA: Diagnosis not present

## 2022-08-07 DIAGNOSIS — I10 Essential (primary) hypertension: Secondary | ICD-10-CM | POA: Diagnosis not present

## 2022-08-07 DIAGNOSIS — N400 Enlarged prostate without lower urinary tract symptoms: Secondary | ICD-10-CM | POA: Diagnosis not present

## 2022-08-25 DIAGNOSIS — N400 Enlarged prostate without lower urinary tract symptoms: Secondary | ICD-10-CM | POA: Diagnosis not present

## 2022-08-25 DIAGNOSIS — Z8249 Family history of ischemic heart disease and other diseases of the circulatory system: Secondary | ICD-10-CM | POA: Diagnosis not present

## 2022-08-25 DIAGNOSIS — E785 Hyperlipidemia, unspecified: Secondary | ICD-10-CM | POA: Diagnosis not present

## 2022-08-25 DIAGNOSIS — N529 Male erectile dysfunction, unspecified: Secondary | ICD-10-CM | POA: Diagnosis not present

## 2022-08-25 DIAGNOSIS — Z803 Family history of malignant neoplasm of breast: Secondary | ICD-10-CM | POA: Diagnosis not present

## 2022-08-25 DIAGNOSIS — Z833 Family history of diabetes mellitus: Secondary | ICD-10-CM | POA: Diagnosis not present

## 2022-08-25 DIAGNOSIS — Z008 Encounter for other general examination: Secondary | ICD-10-CM | POA: Diagnosis not present

## 2022-08-25 DIAGNOSIS — Z87891 Personal history of nicotine dependence: Secondary | ICD-10-CM | POA: Diagnosis not present

## 2022-08-25 DIAGNOSIS — I10 Essential (primary) hypertension: Secondary | ICD-10-CM | POA: Diagnosis not present

## 2022-11-06 DIAGNOSIS — N182 Chronic kidney disease, stage 2 (mild): Secondary | ICD-10-CM | POA: Diagnosis not present

## 2022-11-06 DIAGNOSIS — Z0000 Encounter for general adult medical examination without abnormal findings: Secondary | ICD-10-CM | POA: Diagnosis not present

## 2022-11-06 DIAGNOSIS — Z6825 Body mass index (BMI) 25.0-25.9, adult: Secondary | ICD-10-CM | POA: Diagnosis not present

## 2022-11-06 DIAGNOSIS — N400 Enlarged prostate without lower urinary tract symptoms: Secondary | ICD-10-CM | POA: Diagnosis not present

## 2022-11-06 DIAGNOSIS — N5201 Erectile dysfunction due to arterial insufficiency: Secondary | ICD-10-CM | POA: Diagnosis not present

## 2022-11-06 DIAGNOSIS — E782 Mixed hyperlipidemia: Secondary | ICD-10-CM | POA: Diagnosis not present

## 2022-11-06 DIAGNOSIS — F5101 Primary insomnia: Secondary | ICD-10-CM | POA: Diagnosis not present

## 2022-11-06 DIAGNOSIS — M5431 Sciatica, right side: Secondary | ICD-10-CM | POA: Diagnosis not present

## 2022-11-06 DIAGNOSIS — I10 Essential (primary) hypertension: Secondary | ICD-10-CM | POA: Diagnosis not present

## 2023-02-07 DIAGNOSIS — I10 Essential (primary) hypertension: Secondary | ICD-10-CM | POA: Diagnosis not present

## 2023-02-07 DIAGNOSIS — F5101 Primary insomnia: Secondary | ICD-10-CM | POA: Diagnosis not present

## 2023-02-07 DIAGNOSIS — E782 Mixed hyperlipidemia: Secondary | ICD-10-CM | POA: Diagnosis not present

## 2023-02-07 DIAGNOSIS — M5431 Sciatica, right side: Secondary | ICD-10-CM | POA: Diagnosis not present

## 2023-02-07 DIAGNOSIS — N400 Enlarged prostate without lower urinary tract symptoms: Secondary | ICD-10-CM | POA: Diagnosis not present

## 2023-02-07 DIAGNOSIS — N5201 Erectile dysfunction due to arterial insufficiency: Secondary | ICD-10-CM | POA: Diagnosis not present

## 2023-02-07 DIAGNOSIS — N182 Chronic kidney disease, stage 2 (mild): Secondary | ICD-10-CM | POA: Diagnosis not present

## 2023-02-07 DIAGNOSIS — Z0000 Encounter for general adult medical examination without abnormal findings: Secondary | ICD-10-CM | POA: Diagnosis not present

## 2023-02-07 DIAGNOSIS — Z125 Encounter for screening for malignant neoplasm of prostate: Secondary | ICD-10-CM | POA: Diagnosis not present

## 2023-02-07 DIAGNOSIS — Z6824 Body mass index (BMI) 24.0-24.9, adult: Secondary | ICD-10-CM | POA: Diagnosis not present

## 2023-02-08 ENCOUNTER — Encounter (INDEPENDENT_AMBULATORY_CARE_PROVIDER_SITE_OTHER): Payer: Self-pay | Admitting: *Deleted

## 2023-03-07 DIAGNOSIS — E782 Mixed hyperlipidemia: Secondary | ICD-10-CM | POA: Diagnosis not present

## 2023-03-07 DIAGNOSIS — I10 Essential (primary) hypertension: Secondary | ICD-10-CM | POA: Diagnosis not present

## 2023-03-07 DIAGNOSIS — N182 Chronic kidney disease, stage 2 (mild): Secondary | ICD-10-CM | POA: Diagnosis not present

## 2023-03-26 ENCOUNTER — Other Ambulatory Visit: Payer: Self-pay | Admitting: Urology

## 2023-04-09 DIAGNOSIS — I10 Essential (primary) hypertension: Secondary | ICD-10-CM | POA: Diagnosis not present

## 2023-04-09 DIAGNOSIS — N182 Chronic kidney disease, stage 2 (mild): Secondary | ICD-10-CM | POA: Diagnosis not present

## 2023-04-09 DIAGNOSIS — E782 Mixed hyperlipidemia: Secondary | ICD-10-CM | POA: Diagnosis not present

## 2023-04-17 DIAGNOSIS — M810 Age-related osteoporosis without current pathological fracture: Secondary | ICD-10-CM | POA: Diagnosis not present

## 2023-04-17 DIAGNOSIS — R29890 Loss of height: Secondary | ICD-10-CM | POA: Diagnosis not present

## 2023-04-23 ENCOUNTER — Other Ambulatory Visit: Payer: Self-pay | Admitting: Urology

## 2023-05-17 DIAGNOSIS — J209 Acute bronchitis, unspecified: Secondary | ICD-10-CM | POA: Diagnosis not present

## 2023-05-17 DIAGNOSIS — J069 Acute upper respiratory infection, unspecified: Secondary | ICD-10-CM | POA: Diagnosis not present

## 2023-05-19 DIAGNOSIS — H524 Presbyopia: Secondary | ICD-10-CM | POA: Diagnosis not present

## 2023-05-19 DIAGNOSIS — H52223 Regular astigmatism, bilateral: Secondary | ICD-10-CM | POA: Diagnosis not present

## 2023-05-30 DIAGNOSIS — F5101 Primary insomnia: Secondary | ICD-10-CM | POA: Diagnosis not present

## 2023-05-30 DIAGNOSIS — N5201 Erectile dysfunction due to arterial insufficiency: Secondary | ICD-10-CM | POA: Diagnosis not present

## 2023-05-30 DIAGNOSIS — Z0000 Encounter for general adult medical examination without abnormal findings: Secondary | ICD-10-CM | POA: Diagnosis not present

## 2023-05-30 DIAGNOSIS — I10 Essential (primary) hypertension: Secondary | ICD-10-CM | POA: Diagnosis not present

## 2023-05-30 DIAGNOSIS — M5431 Sciatica, right side: Secondary | ICD-10-CM | POA: Diagnosis not present

## 2023-05-30 DIAGNOSIS — N182 Chronic kidney disease, stage 2 (mild): Secondary | ICD-10-CM | POA: Diagnosis not present

## 2023-05-30 DIAGNOSIS — E782 Mixed hyperlipidemia: Secondary | ICD-10-CM | POA: Diagnosis not present

## 2023-05-30 DIAGNOSIS — Z6825 Body mass index (BMI) 25.0-25.9, adult: Secondary | ICD-10-CM | POA: Diagnosis not present

## 2023-05-30 DIAGNOSIS — N400 Enlarged prostate without lower urinary tract symptoms: Secondary | ICD-10-CM | POA: Diagnosis not present

## 2023-07-30 ENCOUNTER — Other Ambulatory Visit: Payer: Self-pay | Admitting: Urology

## 2023-08-08 ENCOUNTER — Encounter (INDEPENDENT_AMBULATORY_CARE_PROVIDER_SITE_OTHER): Payer: Self-pay | Admitting: *Deleted

## 2023-08-25 ENCOUNTER — Other Ambulatory Visit: Payer: Self-pay | Admitting: Urology

## 2023-08-29 DIAGNOSIS — Z0000 Encounter for general adult medical examination without abnormal findings: Secondary | ICD-10-CM | POA: Diagnosis not present

## 2023-08-29 DIAGNOSIS — Z1159 Encounter for screening for other viral diseases: Secondary | ICD-10-CM | POA: Diagnosis not present

## 2023-08-29 DIAGNOSIS — I10 Essential (primary) hypertension: Secondary | ICD-10-CM | POA: Diagnosis not present

## 2023-08-29 DIAGNOSIS — M7050 Other bursitis of knee, unspecified knee: Secondary | ICD-10-CM | POA: Diagnosis not present

## 2023-08-29 DIAGNOSIS — M5431 Sciatica, right side: Secondary | ICD-10-CM | POA: Diagnosis not present

## 2023-08-29 DIAGNOSIS — Z6825 Body mass index (BMI) 25.0-25.9, adult: Secondary | ICD-10-CM | POA: Diagnosis not present

## 2023-08-29 DIAGNOSIS — F5101 Primary insomnia: Secondary | ICD-10-CM | POA: Diagnosis not present

## 2023-08-29 DIAGNOSIS — N400 Enlarged prostate without lower urinary tract symptoms: Secondary | ICD-10-CM | POA: Diagnosis not present

## 2023-08-29 DIAGNOSIS — N182 Chronic kidney disease, stage 2 (mild): Secondary | ICD-10-CM | POA: Diagnosis not present

## 2023-08-29 DIAGNOSIS — E782 Mixed hyperlipidemia: Secondary | ICD-10-CM | POA: Diagnosis not present

## 2023-08-29 DIAGNOSIS — H6090 Unspecified otitis externa, unspecified ear: Secondary | ICD-10-CM | POA: Diagnosis not present

## 2023-08-29 DIAGNOSIS — N5201 Erectile dysfunction due to arterial insufficiency: Secondary | ICD-10-CM | POA: Diagnosis not present

## 2023-08-31 ENCOUNTER — Encounter (INDEPENDENT_AMBULATORY_CARE_PROVIDER_SITE_OTHER): Payer: Self-pay | Admitting: *Deleted

## 2023-09-17 ENCOUNTER — Telehealth: Payer: Self-pay | Admitting: *Deleted

## 2023-09-17 NOTE — Telephone Encounter (Signed)
  Procedure: colonoscopy  Estimated body mass index is 24.28 kg/m as calculated from the following:   Height as of 03/08/22: 5\' 7"  (1.702 m).   Weight as of 03/08/22: 155 lb (70.3 kg).   Have you had a colonoscopy before?  20 years ago  Do you have family history of colon cancer?  no  Do you have a family history of polyps? no  Previous colonoscopy with polyps removed? no  Do you have a history colorectal cancer?   no  Are you diabetic?  no  Do you have a prosthetic or mechanical heart valve? no  Do you have a pacemaker/defibrillator?   no  Have you had endocarditis/atrial fibrillation?  no  Do you use supplemental oxygen/CPAP?  no  Have you had joint replacement within the last 12 months?  no  Do you tend to be constipated or have to use laxatives?  no   Do you have history of alcohol use? If yes, how much and how often.  no  Do you have history or are you using drugs? If yes, what do are you  using?  no  Have you ever had a stroke/heart attack?  no  Have you ever had a heart or other vascular stent placed,?no  Do you take weight loss medication? no  Do you take any blood-thinning medications such as: (Plavix, aspirin, Coumadin, Aggrenox, Brilinta, Xarelto, Eliquis, Pradaxa, Savaysa or Effient)? no  If yes we need the name, milligram, dosage and who is prescribing doctor:               Current Outpatient Medications on File Prior to Visit  Medication Sig Dispense Refill   amLODipine (NORVASC) 2.5 MG tablet Take 2.5 mg by mouth daily.     tamsulosin  (FLOMAX ) 0.4 MG CAPS capsule Take 1 capsule by mouth twice daily 60 capsule 0   traZODone (DESYREL) 50 MG tablet Take 50 mg by mouth at bedtime.     simvastatin (ZOCOR) 40 MG tablet Take 40 mg by mouth at bedtime.     No current facility-administered medications on file prior to visit.      No Known Allergies

## 2023-09-19 DIAGNOSIS — I739 Peripheral vascular disease, unspecified: Secondary | ICD-10-CM | POA: Diagnosis not present

## 2023-09-23 ENCOUNTER — Other Ambulatory Visit: Payer: Self-pay | Admitting: Urology

## 2023-09-26 ENCOUNTER — Encounter: Payer: Self-pay | Admitting: *Deleted

## 2023-09-26 ENCOUNTER — Other Ambulatory Visit: Payer: Self-pay | Admitting: *Deleted

## 2023-09-26 ENCOUNTER — Encounter (INDEPENDENT_AMBULATORY_CARE_PROVIDER_SITE_OTHER): Payer: Self-pay | Admitting: *Deleted

## 2023-09-26 MED ORDER — PEG 3350-KCL-NA BICARB-NACL 420 G PO SOLR: 4000.0000 mL | Freq: Once | ORAL | 0 refills | Status: AC

## 2023-09-26 NOTE — Telephone Encounter (Signed)
 ASA 2.  TriLyte  prep.

## 2023-09-26 NOTE — Telephone Encounter (Signed)
 Pt has been scheduled for 10/30/23 with Dr.Carver. instructions mailed and prep sent to the pharmacy.

## 2023-09-27 ENCOUNTER — Encounter (INDEPENDENT_AMBULATORY_CARE_PROVIDER_SITE_OTHER): Payer: Self-pay | Admitting: *Deleted

## 2023-09-27 NOTE — Telephone Encounter (Signed)
 Referral completed, [facility] apt letter sent to PCP

## 2023-10-28 ENCOUNTER — Other Ambulatory Visit: Payer: Self-pay | Admitting: Urology

## 2023-10-29 NOTE — Anesthesia Preprocedure Evaluation (Signed)
 Anesthesia Evaluation  Patient identified by MRN, date of birth, ID band Patient awake    Reviewed: Allergy & Precautions, H&P , NPO status , Patient's Chart, lab work & pertinent test results, reviewed documented beta blocker date and time   Airway Mallampati: II  TM Distance: >3 FB Neck ROM: full    Dental no notable dental hx. (+) Dental Advisory Given, Teeth Intact   Pulmonary neg pulmonary ROS   Pulmonary exam normal breath sounds clear to auscultation       Cardiovascular Exercise Tolerance: Good negative cardio ROS Normal cardiovascular exam Rhythm:regular Rate:Normal     Neuro/Psych negative neurological ROS  negative psych ROS   GI/Hepatic negative GI ROS, Neg liver ROS,,,  Endo/Other  negative endocrine ROS    Renal/GU negative Renal ROS  negative genitourinary   Musculoskeletal  (+) Arthritis , Osteoarthritis,    Abdominal   Peds  Hematology negative hematology ROS (+)   Anesthesia Other Findings   Reproductive/Obstetrics negative OB ROS                             Anesthesia Physical Anesthesia Plan  ASA: 2  Anesthesia Plan: General   Post-op Pain Management: Minimal or no pain anticipated   Induction: Intravenous  PONV Risk Score and Plan: Propofol infusion  Airway Management Planned: Nasal Cannula and Natural Airway  Additional Equipment: None  Intra-op Plan:   Post-operative Plan:   Informed Consent: I have reviewed the patients History and Physical, chart, labs and discussed the procedure including the risks, benefits and alternatives for the proposed anesthesia with the patient or authorized representative who has indicated his/her understanding and acceptance.     Dental Advisory Given  Plan Discussed with: CRNA  Anesthesia Plan Comments:        Anesthesia Quick Evaluation

## 2023-10-30 ENCOUNTER — Ambulatory Visit (HOSPITAL_COMMUNITY)
Admission: RE | Admit: 2023-10-30 | Discharge: 2023-10-30 | Disposition: A | Discharge status: Home / Self Care | Attending: Internal Medicine | Admitting: Internal Medicine

## 2023-10-30 ENCOUNTER — Ambulatory Visit (HOSPITAL_COMMUNITY): Payer: Self-pay | Admitting: Anesthesiology

## 2023-10-30 ENCOUNTER — Encounter (HOSPITAL_COMMUNITY)
Admission: RE | Disposition: A | Discharge status: Home / Self Care | Payer: Self-pay | Source: Home / Self Care | Attending: Internal Medicine

## 2023-10-30 ENCOUNTER — Encounter (HOSPITAL_COMMUNITY): Payer: Self-pay | Admitting: Internal Medicine

## 2023-10-30 ENCOUNTER — Other Ambulatory Visit: Payer: Self-pay

## 2023-10-30 DIAGNOSIS — Z79899 Other long term (current) drug therapy: Secondary | ICD-10-CM | POA: Insufficient documentation

## 2023-10-30 DIAGNOSIS — Z1211 Encounter for screening for malignant neoplasm of colon: Secondary | ICD-10-CM

## 2023-10-30 DIAGNOSIS — D122 Benign neoplasm of ascending colon: Secondary | ICD-10-CM | POA: Diagnosis not present

## 2023-10-30 DIAGNOSIS — K5730 Diverticulosis of large intestine without perforation or abscess without bleeding: Secondary | ICD-10-CM | POA: Insufficient documentation

## 2023-10-30 DIAGNOSIS — K648 Other hemorrhoids: Secondary | ICD-10-CM

## 2023-10-30 DIAGNOSIS — M1990 Unspecified osteoarthritis, unspecified site: Secondary | ICD-10-CM | POA: Insufficient documentation

## 2023-10-30 DIAGNOSIS — D123 Benign neoplasm of transverse colon: Secondary | ICD-10-CM | POA: Diagnosis not present

## 2023-10-30 DIAGNOSIS — K635 Polyp of colon: Secondary | ICD-10-CM | POA: Diagnosis not present

## 2023-10-30 DIAGNOSIS — Z139 Encounter for screening, unspecified: Secondary | ICD-10-CM | POA: Diagnosis not present

## 2023-10-30 HISTORY — PX: COLONOSCOPY: SHX5424

## 2023-10-30 SURGERY — COLONOSCOPY
Anesthesia: General

## 2023-10-30 MED ORDER — LACTATED RINGERS IV SOLN: INTRAVENOUS | Status: DC

## 2023-10-30 MED ORDER — PROPOFOL 500 MG/50ML IV EMUL
INTRAVENOUS | Status: DC | PRN
  Administered 2023-10-30: 150 ug/kg/min via INTRAVENOUS
  Administered 2023-10-30: 100 mg via INTRAVENOUS

## 2023-10-30 NOTE — Op Note (Signed)
 [facility] Patient Name: Andres Perry Procedure Date: 10/30/2023 7:18 AM MRN: [id number] Date of Birth: 03-22-1956 Attending MD: Rolando Cliche. Mordechai April , [place], [id number] CSN: [id number] Age: 68 Admit Type: Outpatient Procedure:                Colonoscopy Indications:              Screening for colorectal malignant neoplasm Providers:                Rolando Cliche. Mordechai April, DO, Ashley Goins, Tammy Vaught,                            RN, Annell Barrow Referring MD:              Medicines:                See the Anesthesia note for documentation of the                            administered medications Complications:            No immediate complications. Estimated Blood Loss:     Estimated blood loss was minimal. Procedure:                Pre-Anesthesia Assessment:                           - The anesthesia plan was to use monitored                            anesthesia care (MAC).                           After obtaining informed consent, the colonoscope                            was passed under direct vision. Throughout the                            procedure, the patient's blood pressure, pulse, and                            oxygen saturations were monitored continuously. The                            [id number]-HQ190L ([id number]) scope was introduced through                            the anus and advanced to the the cecum, identified                            by appendiceal orifice and ileocecal valve. The                            colonoscopy was performed without difficulty. The                            patient tolerated the procedure  well. The quality                            of the bowel preparation was evaluated using the                            BBPS [facility] Bowel Preparation Scale) with scores                            of: Right Colon = 3, Transverse Colon = 3 and Left                            Colon = 3 (entire mucosa seen well with no residual                             staining, small fragments of stool or opaque                            liquid). The total BBPS score equals 9. Scope In: 7:59:04 AM Scope Out: 8:17:49 AM Scope Withdrawal Time: 0 hours 14 minutes 21 seconds  Total Procedure Duration: 0 hours 18 minutes 45 seconds  Findings:      Non-bleeding internal hemorrhoids were found during retroflexion. The       hemorrhoids were moderate.      Many large-mouthed and small-mouthed diverticula were found in the       entire colon.      Two sessile polyps were found in the ascending colon. The polyps were 5       to 6 mm in size. These polyps were removed with a cold snare. Resection       and retrieval were complete.      A 5 mm polyp was found in the transverse colon. The polyp was sessile.       The polyp was removed with a cold snare. Resection and retrieval were       complete.      The exam was otherwise without abnormality on direct and retroflexion       views. Impression:               - Non-bleeding internal hemorrhoids.                           - Diverticulosis in the entire examined colon.                           - Two 5 to 6 mm polyps in the ascending colon,                            removed with a cold snare. Resected and retrieved.                           - One 5 mm polyp in the transverse colon, removed                            with a cold snare. Resected and retrieved.                           -  The examination was otherwise normal on direct                            and retroflexion views. Moderate Sedation:      Per Anesthesia Care Recommendation:           - Patient has a contact number available for                            emergencies. The signs and symptoms of potential                            delayed complications were discussed with the                            patient. Return to normal activities tomorrow.                            Written discharge instructions were provided to the                             patient.                           - Resume previous diet.                           - Continue present medications.                           - Await pathology results.                           - Repeat colonoscopy in 5 years for surveillance.                           - Return to GI clinic PRN. Procedure Code(s):        --- Professional ---                           [phone], Colonoscopy, flexible; with removal of                            tumor(s), polyp(s), or other lesion(s) by snare                            technique Diagnosis Code(s):        --- Professional ---                           Z12.11, Encounter for screening for malignant                            neoplasm of colon                           K64.8, Other hemorrhoids  D12.2, Benign neoplasm of ascending colon                           D12.3, Benign neoplasm of transverse colon (hepatic                            flexure or splenic flexure)                           K57.30, Diverticulosis of large intestine without                            perforation or abscess without bleeding CPT copyright 2022 American Medical Association. All rights reserved. The codes documented in this report are preliminary and upon coder review may  be revised to meet current compliance requirements. Rolando Cliche. Mordechai April, DO Rolando Cliche. Mordechai April, DO 10/30/2023 8:23:55 AM This report has been signed electronically. Number of Addenda: 0

## 2023-10-30 NOTE — H&P (Signed)
 Primary Care Physician:  Veda Gerald, MD Primary Gastroenterologist:  Dr. Mordechai April  Pre-Procedure History & Physical: HPI:  Andres Perry is a 68 y.o. male is here for a colonoscopy for colon cancer screening purposes.  Patient denies any family history of colorectal cancer.  Past Medical History:  Diagnosis Date   Hyperlipidemia     Past Surgical History:  Procedure Laterality Date   HERNIA REPAIR Bilateral    approx. 2018    Prior to Admission medications   Medication Sig Start Date End Date Taking? Authorizing Provider  amLODipine (NORVASC) 2.5 MG tablet Take 2.5 mg by mouth daily.   Yes [provider]  simvastatin (ZOCOR) 40 MG tablet Take 40 mg by mouth at bedtime. 05/25/20  Yes [provider]  tamsulosin  (FLOMAX ) 0.4 MG CAPS capsule Take 1 capsule by mouth twice daily 09/25/23  Yes McKenzie, Arden Beck, MD  traZODone (DESYREL) 50 MG tablet Take 50 mg by mouth at bedtime. 11/30/21  Yes [provider]    Allergies as of 09/26/2023   (No Known Allergies)    History reviewed. No pertinent family history.  Social History   Socioeconomic History   Marital status: Married    Spouse name: Not on file   Number of children: Not on file   Years of education: Not on file   Highest education level: Not on file  Occupational History   Not on file  Tobacco Use   Smoking status: Never   Smokeless tobacco: Never  Vaping Use   Vaping status: Never Used  Substance and Sexual Activity   Alcohol use: Not Currently   Drug use: Not Currently   Sexual activity: Not on file  Other Topics Concern   Not on file  Social History Narrative   Not on file   Social Drivers of Health   Financial Resource Strain: Not on file  Food Insecurity: Not on file  Transportation Needs: Not on file  Physical Activity: Not on file  Stress: Not on file  Social Connections: Not on file  Intimate Partner Violence: Not on file    Review of Systems: See HPI,  otherwise negative ROS  Physical Exam: Vital signs in last 24 hours: Temp:  [97.8 F (36.6 C)] 97.8 F (36.6 C) (06/10 0703) Pulse Rate:  [72] 72 (06/10 0703) Resp:  [18] 18 (06/10 0703) BP: (126)/(75) 126/75 (06/10 0703) SpO2:  [95 %] 95 % (06/10 0703) Weight:  [74.8 kg] 74.8 kg (06/10 0703)   General:   Alert,  Well-developed, well-nourished, pleasant and cooperative in NAD Head:  Normocephalic and atraumatic. Eyes:  Sclera clear, no icterus.   Conjunctiva pink. Ears:  Normal auditory acuity. Nose:  No deformity, discharge,  or lesions. Msk:  Symmetrical without gross deformities. Normal posture. Extremities:  Without clubbing or edema. Neurologic:  Alert and  oriented x4;  grossly normal neurologically. Skin:  Intact without significant lesions or rashes. Psych:  Alert and cooperative. Normal mood and affect.  Impression/Plan: Andres Perry is here for a colonoscopy to be performed for colon cancer screening purposes.  The risks of the procedure including infection, bleed, or perforation as well as benefits, limitations, alternatives and imponderables have been reviewed with the patient. Questions have been answered. All parties agreeable.

## 2023-10-30 NOTE — Discharge Instructions (Addendum)
?  Colonoscopy ?Discharge Instructions ? ?Read the instructions outlined below and refer to this sheet in the next few weeks. These discharge instructions provide you with general information on caring for yourself after you leave the hospital. Your doctor may also give you specific instructions. While your treatment has been planned according to the most current medical practices available, unavoidable complications occasionally occur.  ? ?ACTIVITY ?You may resume your regular activity, but move at a slower pace for the next 24 hours.  ?Take frequent rest periods for the next 24 hours.  ?Walking will help get rid of the air and reduce the bloated feeling in your belly (abdomen).  ?No driving for 24 hours (because of the medicine (anesthesia) used during the test).   ?Do not sign any important legal documents or operate any machinery for 24 hours (because of the anesthesia used during the test).  ?NUTRITION ?Drink plenty of fluids.  ?You may resume your normal diet as instructed by your doctor.  ?Begin with a light meal and progress to your normal diet. Heavy or fried foods are harder to digest and may make you feel sick to your stomach (nauseated).  ?Avoid alcoholic beverages for 24 hours or as instructed.  ?MEDICATIONS ?You may resume your normal medications unless your doctor tells you otherwise.  ?WHAT YOU CAN EXPECT TODAY ?Some feelings of bloating in the abdomen.  ?Passage of more gas than usual.  ?Spotting of blood in your stool or on the toilet paper.  ?IF YOU HAD POLYPS REMOVED DURING THE COLONOSCOPY: ?No aspirin products for 7 days or as instructed.  ?No alcohol for 7 days or as instructed.  ?Eat a soft diet for the next 24 hours.  ?FINDING OUT THE RESULTS OF YOUR TEST ?Not all test results are available during your visit. If your test results are not back during the visit, make an appointment with your caregiver to find out the results. Do not assume everything is normal if you have not heard from your  caregiver or the medical facility. It is important for you to follow up on all of your test results.  ?SEEK IMMEDIATE MEDICAL ATTENTION IF: ?You have more than a spotting of blood in your stool.  ?Your belly is swollen (abdominal distention).  ?You are nauseated or vomiting.  ?You have a temperature over 101.  ?You have abdominal pain or discomfort that is severe or gets worse throughout the day.  ? ?Your colonoscopy revealed 3 polyp(s) which I removed successfully. Await pathology results, my office will contact you. I recommend repeating colonoscopy in 5 years for surveillance purposes. You also have diverticulosis and internal hemorrhoids. I would recommend increasing fiber in your diet or adding OTC Benefiber/Metamucil. Be sure to drink at least 4 to 6 glasses of water daily. Follow-up with GI as needed. ? ? ? ?I hope you have a great rest of your week! ? ?Charles K. Carver, D.O. ?Gastroenterology and Hepatology ?[facility] ? ?

## 2023-10-30 NOTE — Transfer of Care (Signed)
 Immediate Anesthesia Transfer of Care Note  Patient: Andres Perry  Procedure(s) Performed: COLONOSCOPY  Patient Location: Endoscopy Unit  Anesthesia Type:General  Level of Consciousness: drowsy  Airway & Oxygen Therapy: Patient Spontanous Breathing  Post-op Assessment: Report given to RN and Post -op Vital signs reviewed and stable  Post vital signs: Reviewed and stable  Last Vitals:  Vitals Value Taken Time  BP 93/49 10/30/23 0819  Temp 36.4 C 10/30/23 0819  Pulse 72 10/30/23 0819  Resp 19 10/30/23 0819  SpO2 96 % 10/30/23 0819    Last Pain:  Vitals:   10/30/23 0819  TempSrc: Axillary  PainSc:       Patients Stated Pain Goal: 6 (10/30/23 0703)  Complications: No notable events documented.

## 2023-10-30 NOTE — Anesthesia Postprocedure Evaluation (Signed)
 Anesthesia Post Note  Patient: Andres Perry  Procedure(s) Performed: COLONOSCOPY  Patient location during evaluation: Endoscopy Anesthesia Type: General Level of consciousness: awake and alert Pain management: pain level controlled Vital Signs Assessment: post-procedure vital signs reviewed and stable Respiratory status: spontaneous breathing, nonlabored ventilation, respiratory function stable and patient connected to nasal cannula oxygen Cardiovascular status: blood pressure returned to baseline and stable Postop Assessment: no apparent nausea or vomiting Anesthetic complications: no   There were no known notable events for this encounter.   Last Vitals:  Vitals:   10/30/23 0819 10/30/23 0823  BP: (!) 93/49 103/65  Pulse: 72 77  Resp: 19 (!) 22  Temp: 36.4 C   SpO2: 96% 97%    Last Pain:  Vitals:   10/30/23 0823  TempSrc:   PainSc: 0-No pain                 Sarha Bartelt L Aulton Routt

## 2023-10-31 ENCOUNTER — Encounter (HOSPITAL_COMMUNITY): Payer: Self-pay | Admitting: Internal Medicine

## 2023-10-31 LAB — SURGICAL PATHOLOGY

## 2023-11-04 ENCOUNTER — Ambulatory Visit: Payer: Self-pay | Admitting: Internal Medicine

## 2023-11-21 DIAGNOSIS — E782 Mixed hyperlipidemia: Secondary | ICD-10-CM | POA: Diagnosis not present

## 2023-11-21 DIAGNOSIS — I10 Essential (primary) hypertension: Secondary | ICD-10-CM | POA: Diagnosis not present

## 2023-11-21 DIAGNOSIS — M7050 Other bursitis of knee, unspecified knee: Secondary | ICD-10-CM | POA: Diagnosis not present

## 2023-11-21 DIAGNOSIS — M5431 Sciatica, right side: Secondary | ICD-10-CM | POA: Diagnosis not present

## 2023-11-21 DIAGNOSIS — N400 Enlarged prostate without lower urinary tract symptoms: Secondary | ICD-10-CM | POA: Diagnosis not present

## 2023-11-21 DIAGNOSIS — Z6825 Body mass index (BMI) 25.0-25.9, adult: Secondary | ICD-10-CM | POA: Diagnosis not present

## 2023-11-21 DIAGNOSIS — N182 Chronic kidney disease, stage 2 (mild): Secondary | ICD-10-CM | POA: Diagnosis not present

## 2023-11-21 DIAGNOSIS — N5201 Erectile dysfunction due to arterial insufficiency: Secondary | ICD-10-CM | POA: Diagnosis not present

## 2023-11-21 DIAGNOSIS — F5101 Primary insomnia: Secondary | ICD-10-CM | POA: Diagnosis not present

## 2023-11-26 ENCOUNTER — Other Ambulatory Visit: Payer: Self-pay | Admitting: Urology

## 2023-12-24 ENCOUNTER — Other Ambulatory Visit: Payer: Self-pay | Admitting: Urology

## 2024-02-01 ENCOUNTER — Other Ambulatory Visit: Payer: Self-pay | Admitting: Urology

## 2024-02-26 DIAGNOSIS — N182 Chronic kidney disease, stage 2 (mild): Secondary | ICD-10-CM | POA: Diagnosis not present

## 2024-02-26 DIAGNOSIS — E782 Mixed hyperlipidemia: Secondary | ICD-10-CM | POA: Diagnosis not present

## 2024-02-26 DIAGNOSIS — N400 Enlarged prostate without lower urinary tract symptoms: Secondary | ICD-10-CM | POA: Diagnosis not present

## 2024-02-26 DIAGNOSIS — Z6826 Body mass index (BMI) 26.0-26.9, adult: Secondary | ICD-10-CM | POA: Diagnosis not present

## 2024-02-26 DIAGNOSIS — M5431 Sciatica, right side: Secondary | ICD-10-CM | POA: Diagnosis not present

## 2024-02-26 DIAGNOSIS — N5201 Erectile dysfunction due to arterial insufficiency: Secondary | ICD-10-CM | POA: Diagnosis not present

## 2024-02-26 DIAGNOSIS — I10 Essential (primary) hypertension: Secondary | ICD-10-CM | POA: Diagnosis not present

## 2024-02-26 DIAGNOSIS — M7050 Other bursitis of knee, unspecified knee: Secondary | ICD-10-CM | POA: Diagnosis not present

## 2024-02-26 DIAGNOSIS — F5101 Primary insomnia: Secondary | ICD-10-CM | POA: Diagnosis not present

## 2024-02-27 DIAGNOSIS — H2513 Age-related nuclear cataract, bilateral: Secondary | ICD-10-CM | POA: Diagnosis not present

## 2024-02-27 DIAGNOSIS — H52223 Regular astigmatism, bilateral: Secondary | ICD-10-CM | POA: Diagnosis not present

## 2024-02-27 DIAGNOSIS — H04123 Dry eye syndrome of bilateral lacrimal glands: Secondary | ICD-10-CM | POA: Diagnosis not present

## 2024-02-27 DIAGNOSIS — H5203 Hypermetropia, bilateral: Secondary | ICD-10-CM | POA: Diagnosis not present

## 2024-02-27 DIAGNOSIS — H524 Presbyopia: Secondary | ICD-10-CM | POA: Diagnosis not present
